# Patient Record
Sex: Male | Born: 1970 | Race: Black or African American | Marital: Married | Smoking: Never smoker
Health system: Southern US, Community
[De-identification: ages and names within clinical notes are randomized; demographics above are authoritative.]

## PROBLEM LIST (undated history)

## (undated) DIAGNOSIS — M519 Unspecified thoracic, thoracolumbar and lumbosacral intervertebral disc disorder: Secondary | ICD-10-CM

## (undated) DIAGNOSIS — E785 Hyperlipidemia, unspecified: Secondary | ICD-10-CM

## (undated) DIAGNOSIS — N419 Inflammatory disease of prostate, unspecified: Secondary | ICD-10-CM

## (undated) DIAGNOSIS — K219 Gastro-esophageal reflux disease without esophagitis: Secondary | ICD-10-CM

## (undated) DIAGNOSIS — K8020 Calculus of gallbladder without cholecystitis without obstruction: Secondary | ICD-10-CM

## (undated) DIAGNOSIS — IMO0002 Reserved for concepts with insufficient information to code with codable children: Secondary | ICD-10-CM

## (undated) DIAGNOSIS — K589 Irritable bowel syndrome without diarrhea: Secondary | ICD-10-CM

## (undated) HISTORY — PX: OTHER SURGICAL HISTORY: SHX169

## (undated) HISTORY — DX: Hyperlipidemia, unspecified: E78.5

## (undated) HISTORY — DX: Inflammatory disease of prostate, unspecified: N41.9

## (undated) HISTORY — DX: Calculus of gallbladder without cholecystitis without obstruction: K80.20

## (undated) HISTORY — PX: BUNIONECTOMY: SHX129

## (undated) HISTORY — DX: Unspecified thoracic, thoracolumbar and lumbosacral intervertebral disc disorder: M51.9

## (undated) HISTORY — DX: Irritable bowel syndrome without diarrhea: K58.9

## (undated) HISTORY — DX: Reserved for concepts with insufficient information to code with codable children: IMO0002

---
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
## 2000-09-09 ENCOUNTER — Emergency Department (HOSPITAL_COMMUNITY): Admission: EM | Admit: 2000-09-09 | Discharge: 2000-09-09 | Payer: Self-pay | Admitting: Emergency Medicine

## 2000-09-09 ENCOUNTER — Encounter: Payer: Self-pay | Admitting: Emergency Medicine

## 2000-09-15 ENCOUNTER — Emergency Department (HOSPITAL_COMMUNITY): Admission: EM | Admit: 2000-09-15 | Discharge: 2000-09-15 | Payer: Self-pay | Admitting: Emergency Medicine

## 2004-01-16 ENCOUNTER — Emergency Department (HOSPITAL_COMMUNITY): Admission: EM | Admit: 2004-01-16 | Discharge: 2004-01-17 | Payer: Self-pay | Admitting: Emergency Medicine

## 2004-01-25 ENCOUNTER — Ambulatory Visit: Payer: Self-pay | Admitting: Internal Medicine

## 2004-01-25 ENCOUNTER — Ambulatory Visit (HOSPITAL_COMMUNITY): Admission: RE | Admit: 2004-01-25 | Discharge: 2004-01-25 | Payer: Self-pay | Admitting: Internal Medicine

## 2004-07-08 ENCOUNTER — Ambulatory Visit: Payer: Self-pay | Admitting: Internal Medicine

## 2005-03-09 ENCOUNTER — Ambulatory Visit: Payer: Self-pay | Admitting: Internal Medicine

## 2005-10-19 ENCOUNTER — Ambulatory Visit: Payer: Self-pay | Admitting: Internal Medicine

## 2005-10-31 ENCOUNTER — Ambulatory Visit: Payer: Self-pay | Admitting: Internal Medicine

## 2006-08-09 ENCOUNTER — Encounter: Admission: RE | Admit: 2006-08-09 | Discharge: 2006-08-09 | Payer: Self-pay | Admitting: Internal Medicine

## 2006-08-19 ENCOUNTER — Encounter (INDEPENDENT_AMBULATORY_CARE_PROVIDER_SITE_OTHER): Payer: Self-pay | Admitting: *Deleted

## 2006-08-19 ENCOUNTER — Encounter: Admission: RE | Admit: 2006-08-19 | Discharge: 2006-08-19 | Payer: Self-pay | Admitting: Internal Medicine

## 2006-09-21 ENCOUNTER — Encounter: Payer: Self-pay | Admitting: Internal Medicine

## 2006-09-21 DIAGNOSIS — N419 Inflammatory disease of prostate, unspecified: Secondary | ICD-10-CM | POA: Insufficient documentation

## 2006-09-21 DIAGNOSIS — IMO0002 Reserved for concepts with insufficient information to code with codable children: Secondary | ICD-10-CM

## 2006-09-21 DIAGNOSIS — S36112A Contusion of liver, initial encounter: Secondary | ICD-10-CM | POA: Insufficient documentation

## 2006-09-21 DIAGNOSIS — Z9189 Other specified personal risk factors, not elsewhere classified: Secondary | ICD-10-CM | POA: Insufficient documentation

## 2006-09-21 HISTORY — DX: Inflammatory disease of prostate, unspecified: N41.9

## 2006-09-21 HISTORY — DX: Reserved for concepts with insufficient information to code with codable children: IMO0002

## 2006-09-26 DIAGNOSIS — M545 Low back pain: Secondary | ICD-10-CM

## 2008-09-14 IMAGING — CT CT ABDOMEN W/ CM
2 of 5 series · 17 of 46 positions shown, 19 images · IV contrast (30CC OMNI 350 & [ID] OMNI 300)
Comparison: None. 
 CT ABDOMEN WITH CONTRAST:

CLINICAL DATA: Right upper quadrant pain. 
 CT ABDOMEN AND PELVIS WITH CONTRAST:
TECHNIQUE: Multidetector CT imaging of the abdomen and pelvis was performed following the standard protocol during bolus administration of intravenous contrast.
 Contrast:  777cc Omnipaque 300.

[Series 2: abdomen w/ · axial · 0.75mm/px · z∈[-324,+36]mm · 14 of 82 slices shown, 16 images]
[im 5/82  soft-tissue]
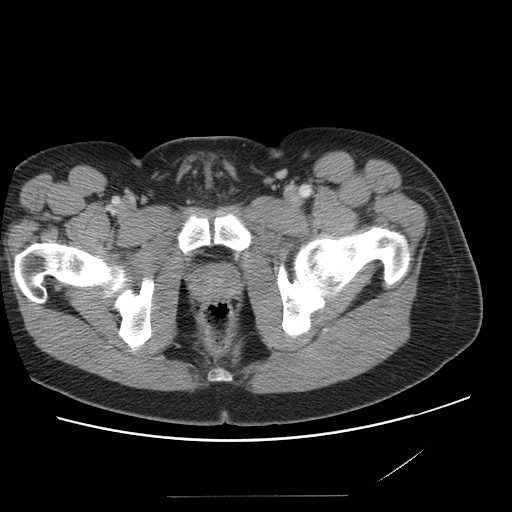
[im 5/82  bone]
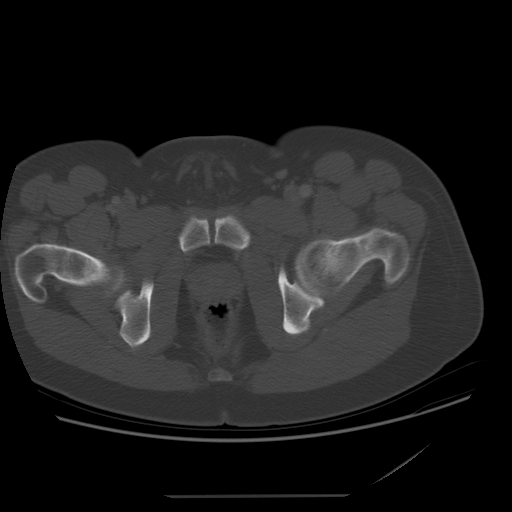
[im 13/82  soft-tissue]
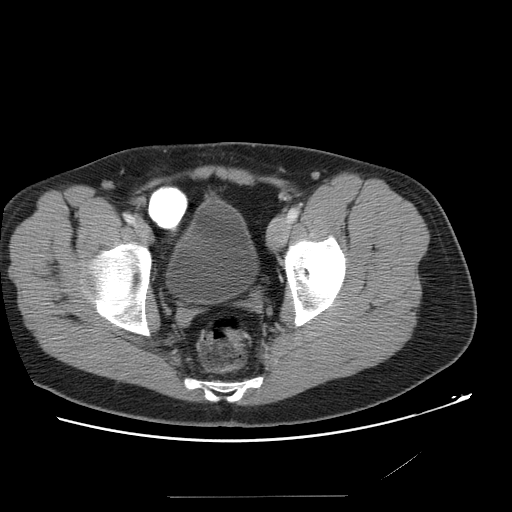
[im 17/82  soft-tissue]
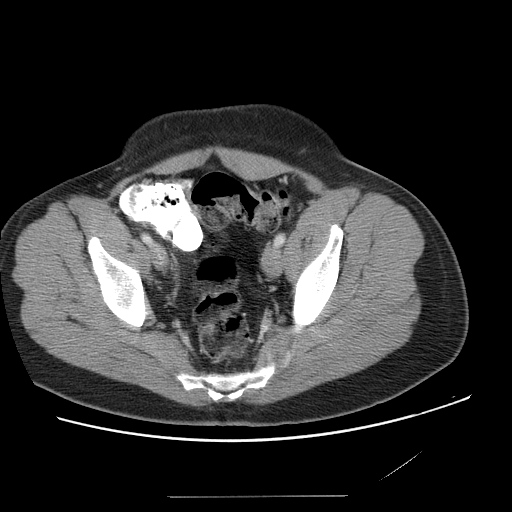
[im 21/82  soft-tissue]
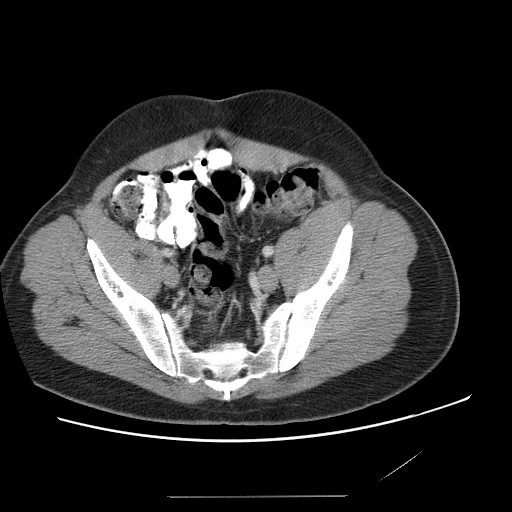
[im 29/82  soft-tissue]
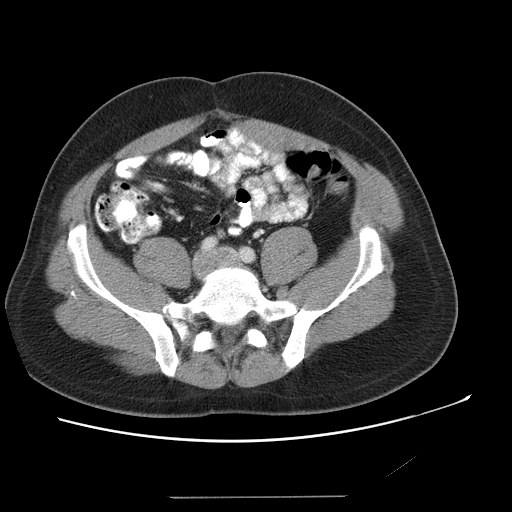
[im 33/82  soft-tissue]
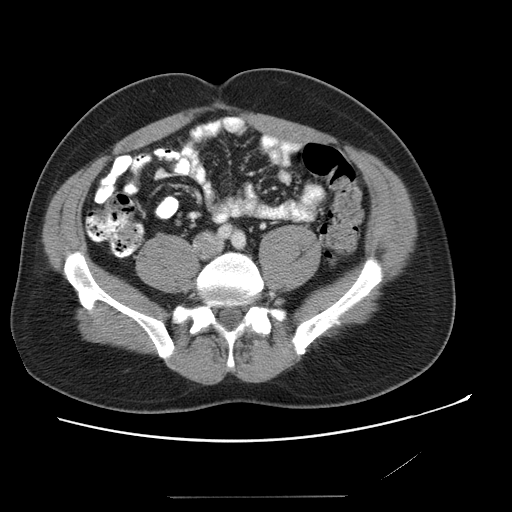
[im 37/82  soft-tissue]
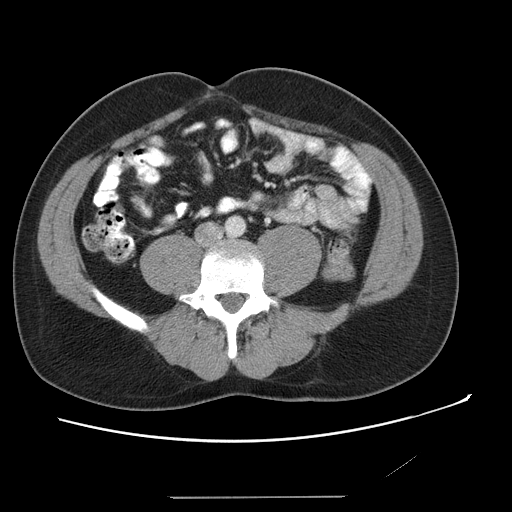
[im 45/82  soft-tissue]
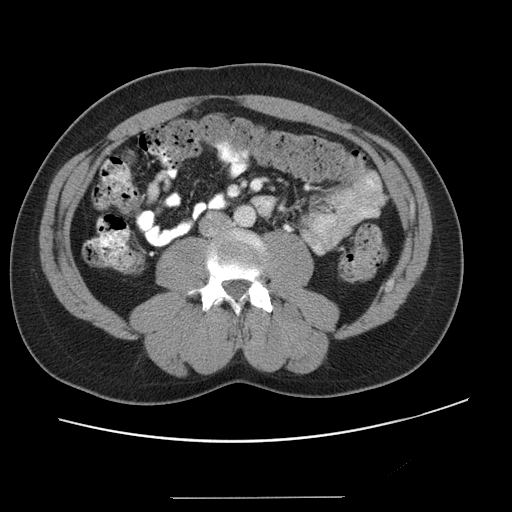
[im 49/82  soft-tissue]
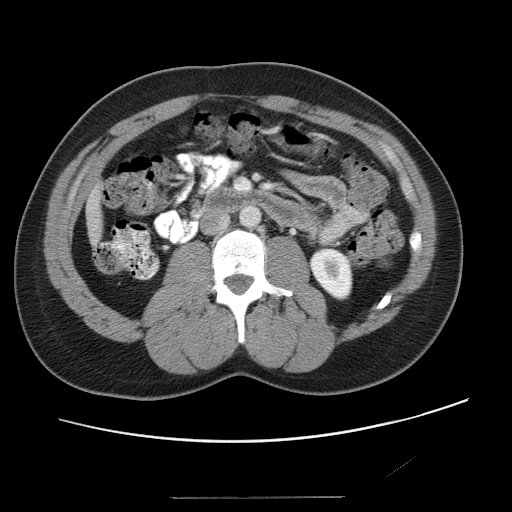
[im 49/82  bone]
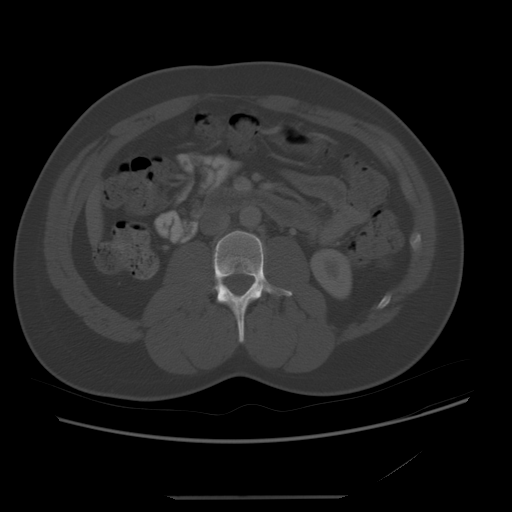
[im 53/82  soft-tissue]
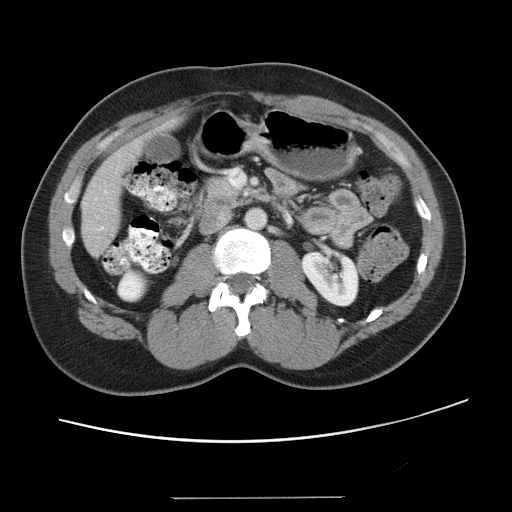
[im 61/82  soft-tissue]
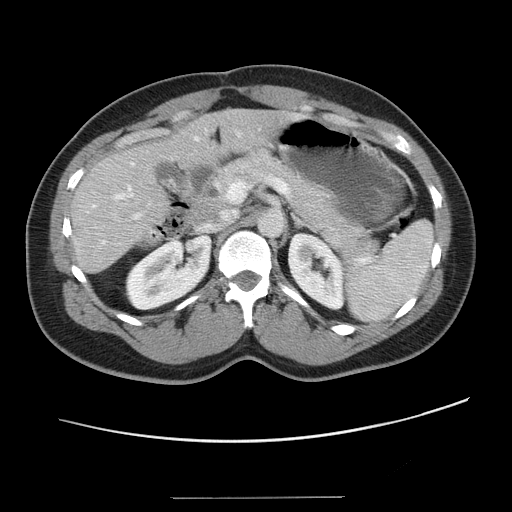
[im 65/82  soft-tissue]
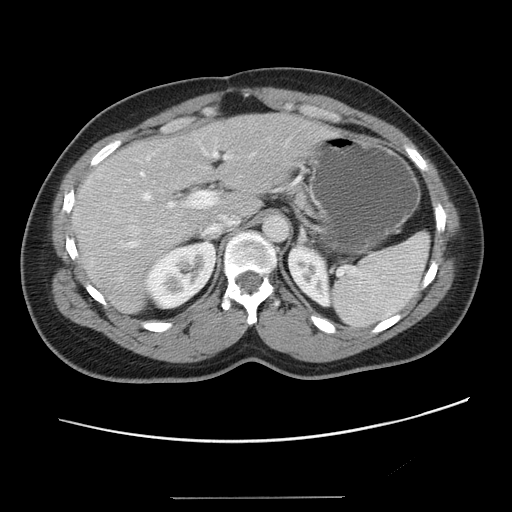
[im 69/82  soft-tissue]
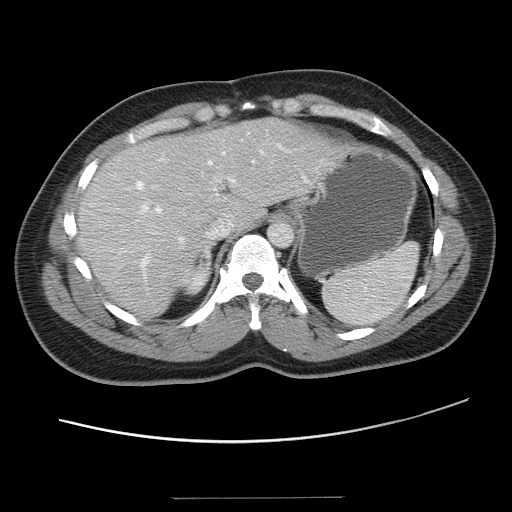
[im 77/82  soft-tissue]
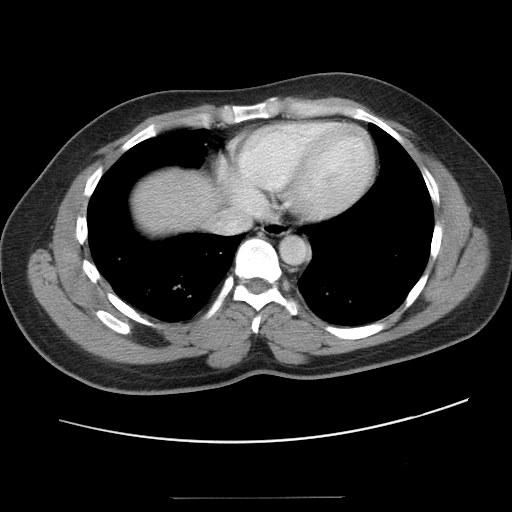

[Series 401: coronal · coronal · 0.86mm/px · 3 of 118 slices shown]
[im 40/118  soft-tissue]
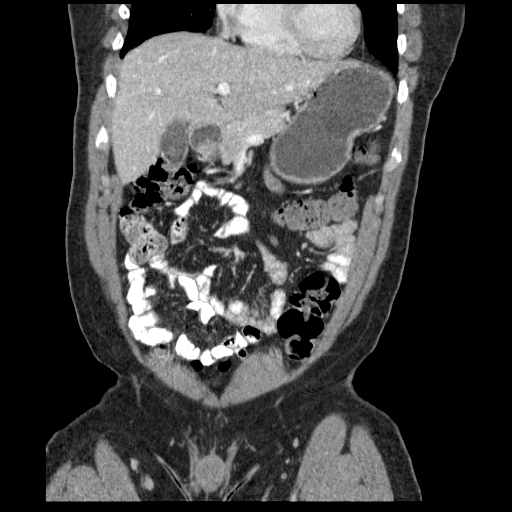
[im 53/118  soft-tissue]
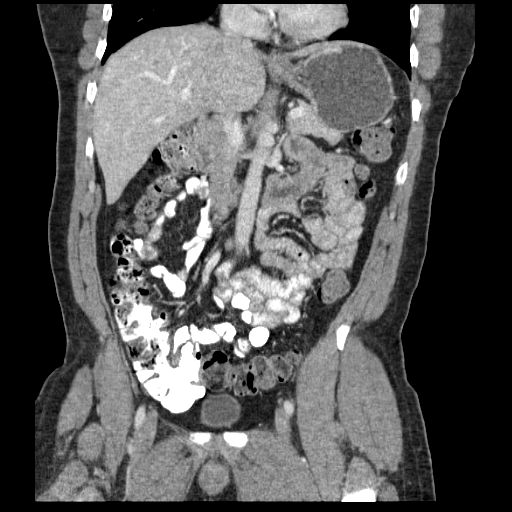
[im 66/118  soft-tissue]
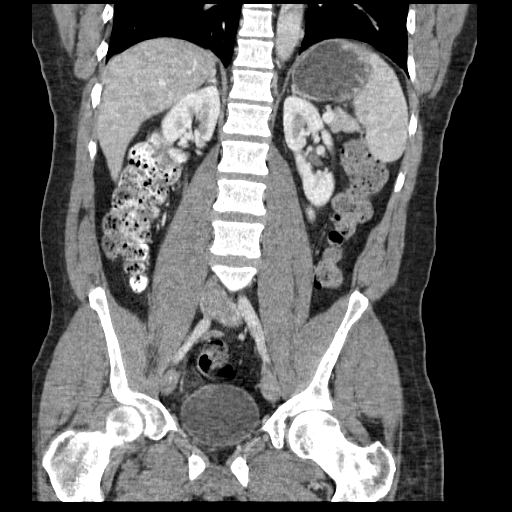

[17 of 46 positions shown; findings below may reference images not displayed]

FINDINGS: The lung bases are clear.  The liver enhances with no focal abnormality and no ductal dilatation is seen.  There is nodular attenuation within the gallbladder consistent with non calcified gallstones when compared to an ultrasound of 08/09/06.  No gallbladder wall thickening is seen.  Pancreas is normal in size and the pancreatic duct is not dilated.  The adrenal glands and spleen appear normal.   Kidneys enhance normally and on delayed images the pelvocaliceal systems appear normal.  The abdominal aorta is normal in caliber.  There is feces throughout the colon.
IMPRESSION: Non calcified gallstones in gallbladder.  No gallbladder wall thickening or ductal dilatation is seen.  
 CT PELVIS WITH CONTRAST:
FINDINGS: The appendix is well seen and appears normal.  Urinary bladder is unremarkable.  No bony abnormality is seen.
IMPRESSION: Negative CT of the pelvis.  No mass or adenopathy.  Appendix appears normal.

## 2008-10-26 ENCOUNTER — Ambulatory Visit: Payer: Self-pay | Admitting: Gastroenterology

## 2008-10-26 DIAGNOSIS — K589 Irritable bowel syndrome without diarrhea: Secondary | ICD-10-CM

## 2008-10-26 HISTORY — DX: Irritable bowel syndrome, unspecified: K58.9

## 2008-10-27 ENCOUNTER — Telehealth (INDEPENDENT_AMBULATORY_CARE_PROVIDER_SITE_OTHER): Payer: Self-pay | Admitting: *Deleted

## 2008-10-27 LAB — CONVERTED CEMR LAB
AST: 85 units/L — ABNORMAL HIGH (ref 0–37)
BUN: 10 mg/dL (ref 6–23)
Basophils Relative: 0.1 % (ref 0.0–3.0)
Calcium: 8.8 mg/dL (ref 8.4–10.5)
Chloride: 104 meq/L (ref 96–112)
Creatinine, Ser: 1.2 mg/dL (ref 0.4–1.5)
Eosinophils Absolute: 0.2 10*3/uL (ref 0.0–0.7)
GFR calc non Af Amer: 86.76 mL/min (ref >60)
HCT: 45.3 % (ref 39.0–52.0)
Hemoglobin: 15.8 g/dL (ref 13.0–17.0)
Lymphocytes Relative: 27.7 % (ref 12.0–46.0)
Lymphs Abs: 2.2 10*3/uL (ref 0.7–4.0)
MCHC: 34.8 g/dL (ref 30.0–36.0)
MCV: 88.5 fL (ref 78.0–100.0)
Neutro Abs: 4.9 10*3/uL (ref 1.4–7.7)
RBC: 5.12 M/uL (ref 4.22–5.81)
RDW: 13.4 % (ref 11.5–14.6)
Total Bilirubin: 1.1 mg/dL (ref 0.3–1.2)

## 2008-11-12 ENCOUNTER — Ambulatory Visit: Payer: Self-pay | Admitting: Gastroenterology

## 2008-11-15 LAB — CONVERTED CEMR LAB
ALT: 25 units/L (ref 0–53)
Alkaline Phosphatase: 61 units/L (ref 39–117)
Bilirubin, Direct: 0.2 mg/dL (ref 0.0–0.3)
Total Bilirubin: 1.2 mg/dL (ref 0.3–1.2)

## 2010-01-29 ENCOUNTER — Encounter: Payer: Self-pay | Admitting: Internal Medicine

## 2011-07-05 ENCOUNTER — Telehealth: Payer: Self-pay | Admitting: Internal Medicine

## 2011-07-05 NOTE — Telephone Encounter (Signed)
Pt was last seen in 2007.  He would like to be worked in for back pain before Tues.  He has [employer].

## 2011-07-05 NOTE — Telephone Encounter (Signed)
Ok with me 

## 2011-07-06 NOTE — Telephone Encounter (Signed)
APPT Monday July 1.

## 2011-07-07 ENCOUNTER — Encounter: Payer: Self-pay | Admitting: Internal Medicine

## 2011-07-07 DIAGNOSIS — Z0001 Encounter for general adult medical examination with abnormal findings: Secondary | ICD-10-CM | POA: Insufficient documentation

## 2011-07-07 DIAGNOSIS — M545 Low back pain: Secondary | ICD-10-CM | POA: Insufficient documentation

## 2011-07-09 ENCOUNTER — Ambulatory Visit (INDEPENDENT_AMBULATORY_CARE_PROVIDER_SITE_OTHER): Payer: BC Managed Care – PPO | Admitting: Internal Medicine

## 2011-07-09 ENCOUNTER — Encounter: Payer: Self-pay | Admitting: Internal Medicine

## 2011-07-09 ENCOUNTER — Other Ambulatory Visit (INDEPENDENT_AMBULATORY_CARE_PROVIDER_SITE_OTHER): Payer: BC Managed Care – PPO

## 2011-07-09 VITALS — BP 120/90 | HR 84 | Temp 98.0°F | Ht 66.0 in | Wt 185.0 lb

## 2011-07-09 DIAGNOSIS — Z0000 Encounter for general adult medical examination without abnormal findings: Secondary | ICD-10-CM

## 2011-07-09 DIAGNOSIS — M519 Unspecified thoracic, thoracolumbar and lumbosacral intervertebral disc disorder: Secondary | ICD-10-CM | POA: Insufficient documentation

## 2011-07-09 DIAGNOSIS — R209 Unspecified disturbances of skin sensation: Secondary | ICD-10-CM

## 2011-07-09 DIAGNOSIS — K8020 Calculus of gallbladder without cholecystitis without obstruction: Secondary | ICD-10-CM | POA: Insufficient documentation

## 2011-07-09 DIAGNOSIS — R202 Paresthesia of skin: Secondary | ICD-10-CM

## 2011-07-09 HISTORY — DX: Unspecified thoracic, thoracolumbar and lumbosacral intervertebral disc disorder: M51.9

## 2011-07-09 HISTORY — DX: Calculus of gallbladder without cholecystitis without obstruction: K80.20

## 2011-07-09 LAB — CBC WITH DIFFERENTIAL/PLATELET
Basophils Relative: 0.5 % (ref 0.0–3.0)
Eosinophils Absolute: 0.5 10*3/uL (ref 0.0–0.7)
HCT: 45.1 % (ref 39.0–52.0)
Hemoglobin: 15.5 g/dL (ref 13.0–17.0)
MCHC: 34.4 g/dL (ref 30.0–36.0)
MCV: 87.5 fl (ref 78.0–100.0)
Monocytes Absolute: 0.6 10*3/uL (ref 0.1–1.0)
Neutro Abs: 6.4 10*3/uL (ref 1.4–7.7)
RBC: 5.16 Mil/uL (ref 4.22–5.81)

## 2011-07-09 LAB — URINALYSIS, ROUTINE W REFLEX MICROSCOPIC
Bilirubin Urine: NEGATIVE
Ketones, ur: NEGATIVE
Leukocytes, UA: NEGATIVE
Nitrite: NEGATIVE
Total Protein, Urine: NEGATIVE
Urine Glucose: NEGATIVE
Urobilinogen, UA: 1 (ref 0.0–1.0)

## 2011-07-09 LAB — LIPID PANEL
Cholesterol: 233 mg/dL — ABNORMAL HIGH (ref 0–200)
Total CHOL/HDL Ratio: 6
Triglycerides: 205 mg/dL — ABNORMAL HIGH (ref 0.0–149.0)
VLDL: 41 mg/dL — ABNORMAL HIGH (ref 0.0–40.0)

## 2011-07-09 LAB — HEPATIC FUNCTION PANEL
Bilirubin, Direct: 0.1 mg/dL (ref 0.0–0.3)
Total Bilirubin: 1 mg/dL (ref 0.3–1.2)

## 2011-07-09 LAB — BASIC METABOLIC PANEL
Calcium: 9.3 mg/dL (ref 8.4–10.5)
Creatinine, Ser: 1.1 mg/dL (ref 0.4–1.5)

## 2011-07-09 LAB — PSA: PSA: 0.41 ng/mL (ref 0.10–4.00)

## 2011-07-09 MED ORDER — NAPROXEN 500 MG PO TABS: 500.0000 mg | ORAL_TABLET | Freq: Two times a day (BID) | ORAL | Status: DC

## 2011-07-09 MED ORDER — CYCLOBENZAPRINE HCL 5 MG PO TABS: 5.0000 mg | ORAL_TABLET | Freq: Three times a day (TID) | ORAL | Status: AC | PRN

## 2011-07-09 NOTE — Assessment & Plan Note (Signed)
Today located somewhat high lumbar region, exam benign,  for nsaid/flexeril trial prn,  to f/u any worsening symptoms or concerns

## 2011-07-09 NOTE — Assessment & Plan Note (Signed)
Typical bilat early cts like symptoms  - for b12 level with labs, also for bilat wrsit splint at night prn

## 2011-07-09 NOTE — Patient Instructions (Addendum)
Take all new medications as prescribed Continue all other medications as before Please use the wrist splints at night only to help with the numbness in the AM Please go to LAB in the Basement for the blood and/or urine tests to be done today You will be contacted by phone if any changes need to be made immediately.  Otherwise, you will receive a letter about your results with an explanation. Please return in 1 year for your yearly visit, or sooner if needed, with Lab testing done 3-5 days before
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
## 2011-07-14 ENCOUNTER — Encounter: Payer: Self-pay | Admitting: Internal Medicine

## 2011-07-14 NOTE — Assessment & Plan Note (Signed)

## 2011-10-29 ENCOUNTER — Telehealth: Payer: Self-pay

## 2011-10-29 NOTE — Telephone Encounter (Signed)
Completed letter and informed the patient and faxed to [phone] per pt. Request.

## 2011-10-29 NOTE — Telephone Encounter (Signed)
Based on his last exam as I documented July 2013, and his MRI from 2006, all I could say is to avoid repetitive bending, as well as lifting more than 50 lbs as he was o/w doing ok  If this is ok, robin to do letter

## 2011-10-29 NOTE — Telephone Encounter (Signed)
Pt called requesting a supportive letter stating his limitations and how they prevent him from working? Please advise

## 2012-03-03 ENCOUNTER — Encounter: Payer: Self-pay | Admitting: Internal Medicine

## 2012-03-03 ENCOUNTER — Ambulatory Visit (INDEPENDENT_AMBULATORY_CARE_PROVIDER_SITE_OTHER): Payer: BC Managed Care – PPO | Admitting: Internal Medicine

## 2012-03-03 VITALS — BP 132/100 | HR 74 | Temp 98.0°F | Ht 66.0 in | Wt 186.0 lb

## 2012-03-03 DIAGNOSIS — R030 Elevated blood-pressure reading, without diagnosis of hypertension: Secondary | ICD-10-CM | POA: Insufficient documentation

## 2012-03-03 DIAGNOSIS — M545 Low back pain: Secondary | ICD-10-CM

## 2012-03-03 MED ORDER — TRAMADOL HCL 50 MG PO TABS: 50.0000 mg | ORAL_TABLET | Freq: Four times a day (QID) | ORAL | Status: DC | PRN

## 2012-03-03 MED ORDER — TIZANIDINE HCL 4 MG PO TABS: 4.0000 mg | ORAL_TABLET | Freq: Four times a day (QID) | ORAL | Status: DC | PRN

## 2012-03-03 NOTE — Assessment & Plan Note (Signed)
With mod to severe flare it seems with stable neuro exam, for change flexeril to zanaflex and pain control, finding today mostly subjective, might benefit from pain clinic, has not seen ortho recently - will refer
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
## 2012-03-03 NOTE — Patient Instructions (Addendum)
Please take all new medication as prescribed Please continue all other medications as before, and refills have been done if requested. You will be contacted regarding the referral for: orthopedic Thank you for enrolling in MyChart. Please follow the instructions below to securely access your online medical record. MyChart allows you to send messages to your doctor, view your test results, renew your prescriptions, schedule appointments, and more. To Log into My Chart online, please go by [employer] or [employer] to [email or web address], or download the MyChart App from the [employer] of [employer] .  Your Username is: [email or web address]  (password [phone]) Please send a practice Message on Mychart later today. Please return in July 2014, or sooner if needed, with Lab testing done 3-5 days before

## 2012-03-03 NOTE — Assessment & Plan Note (Signed)
Asked wife for diligence in helping to monitor BP at home, goal < 140/90

## 2012-03-04 ENCOUNTER — Telehealth: Payer: Self-pay

## 2012-03-04 NOTE — Telephone Encounter (Signed)
I respectfully decline, as this is not officially necessary in the disability application process

## 2012-03-04 NOTE — Telephone Encounter (Signed)
Called left message to call back 

## 2012-03-04 NOTE — Telephone Encounter (Signed)
Message copied by Pincus Sanes on Tue Mar 04, 2012  9:42 AM ------      Message from: Corwin Levins      Created: Mon Mar 03, 2012  4:06 PM       This is normally not needed, as they go by the medical record and an exam by an independent MD at the time of application      ----- Message -----         From: Vladimir Crofts Ewing         Sent: 03/03/2012   3:02 PM           To: Corwin Levins, MD            The patient would like to know if you could write a letter stating his limitations for work.  He is trying to get disability       ------

## 2012-03-04 NOTE — Telephone Encounter (Signed)
Informed the patient of MD's response to request.  The patient stated his Mark Adams has asked for a letter.

## 2012-03-05 NOTE — Telephone Encounter (Signed)
Called the patient informed of MD's instructions. 

## 2012-04-16 ENCOUNTER — Other Ambulatory Visit: Payer: Self-pay | Admitting: Internal Medicine

## 2012-11-13 ENCOUNTER — Other Ambulatory Visit: Payer: Self-pay

## 2013-02-12 ENCOUNTER — Other Ambulatory Visit: Payer: Self-pay | Admitting: Internal Medicine

## 2013-03-07 ENCOUNTER — Other Ambulatory Visit: Payer: Self-pay | Admitting: Internal Medicine

## 2013-03-27 ENCOUNTER — Other Ambulatory Visit: Payer: Self-pay | Admitting: Internal Medicine

## 2013-05-06 ENCOUNTER — Other Ambulatory Visit: Payer: Self-pay | Admitting: Internal Medicine

## 2013-06-02 ENCOUNTER — Other Ambulatory Visit: Payer: Self-pay | Admitting: Internal Medicine

## 2013-06-07 ENCOUNTER — Other Ambulatory Visit: Payer: Self-pay | Admitting: Internal Medicine

## 2013-09-08 ENCOUNTER — Encounter: Payer: Self-pay | Admitting: Internal Medicine

## 2013-09-08 ENCOUNTER — Ambulatory Visit (INDEPENDENT_AMBULATORY_CARE_PROVIDER_SITE_OTHER)
Admission: RE | Admit: 2013-09-08 | Discharge: 2013-09-08 | Disposition: A | Discharge status: Home / Self Care | Payer: BC Managed Care – PPO | Source: Ambulatory Visit | Attending: Internal Medicine | Admitting: Internal Medicine

## 2013-09-08 ENCOUNTER — Ambulatory Visit (INDEPENDENT_AMBULATORY_CARE_PROVIDER_SITE_OTHER): Payer: BC Managed Care – PPO | Admitting: Internal Medicine

## 2013-09-08 ENCOUNTER — Other Ambulatory Visit (INDEPENDENT_AMBULATORY_CARE_PROVIDER_SITE_OTHER): Payer: BC Managed Care – PPO

## 2013-09-08 VITALS — BP 132/80 | HR 82 | Temp 98.4°F | Wt 187.0 lb

## 2013-09-08 DIAGNOSIS — K5900 Constipation, unspecified: Secondary | ICD-10-CM

## 2013-09-08 DIAGNOSIS — R220 Localized swelling, mass and lump, head: Secondary | ICD-10-CM

## 2013-09-08 DIAGNOSIS — R05 Cough: Secondary | ICD-10-CM

## 2013-09-08 DIAGNOSIS — R209 Unspecified disturbances of skin sensation: Secondary | ICD-10-CM

## 2013-09-08 DIAGNOSIS — R059 Cough, unspecified: Secondary | ICD-10-CM | POA: Insufficient documentation

## 2013-09-08 DIAGNOSIS — M25562 Pain in left knee: Secondary | ICD-10-CM

## 2013-09-08 DIAGNOSIS — M25569 Pain in unspecified knee: Secondary | ICD-10-CM

## 2013-09-08 DIAGNOSIS — Z0000 Encounter for general adult medical examination without abnormal findings: Secondary | ICD-10-CM

## 2013-09-08 DIAGNOSIS — Z23 Encounter for immunization: Secondary | ICD-10-CM

## 2013-09-08 DIAGNOSIS — R1013 Epigastric pain: Secondary | ICD-10-CM

## 2013-09-08 DIAGNOSIS — M25561 Pain in right knee: Secondary | ICD-10-CM

## 2013-09-08 DIAGNOSIS — K3189 Other diseases of stomach and duodenum: Secondary | ICD-10-CM

## 2013-09-08 DIAGNOSIS — R202 Paresthesia of skin: Secondary | ICD-10-CM

## 2013-09-08 DIAGNOSIS — R221 Localized swelling, mass and lump, neck: Secondary | ICD-10-CM

## 2013-09-08 LAB — BASIC METABOLIC PANEL
BUN: 12 mg/dL (ref 6–23)
CHLORIDE: 104 meq/L (ref 96–112)
CO2: 25 mEq/L (ref 19–32)
Calcium: 9.4 mg/dL (ref 8.4–10.5)
Creatinine, Ser: 1.2 mg/dL (ref 0.4–1.5)
GFR: 88.97 mL/min (ref >60.00)
Glucose, Bld: 87 mg/dL (ref 70–99)
Potassium: 4.2 mEq/L (ref 3.5–5.1)
SODIUM: 138 meq/L (ref 135–145)

## 2013-09-08 LAB — CBC WITH DIFFERENTIAL/PLATELET
Basophils Absolute: 0 10*3/uL (ref 0.0–0.1)
Basophils Relative: 0.5 % (ref 0.0–3.0)
EOS ABS: 0.2 10*3/uL (ref 0.0–0.7)
Eosinophils Relative: 2 % (ref 0.0–5.0)
HEMATOCRIT: 45.8 % (ref 39.0–52.0)
Hemoglobin: 15.7 g/dL (ref 13.0–17.0)
LYMPHS ABS: 2.3 10*3/uL (ref 0.7–4.0)
Lymphocytes Relative: 27.5 % (ref 12.0–46.0)
MCHC: 34.3 g/dL (ref 30.0–36.0)
MCV: 87.6 fl (ref 78.0–100.0)
MONO ABS: 0.5 10*3/uL (ref 0.1–1.0)
Monocytes Relative: 6.5 % (ref 3.0–12.0)
Neutro Abs: 5.3 10*3/uL (ref 1.4–7.7)
Neutrophils Relative %: 63.5 % (ref 43.0–77.0)
PLATELETS: 243 10*3/uL (ref 150.0–400.0)
RBC: 5.23 Mil/uL (ref 4.22–5.81)
RDW: 14.4 % (ref 11.5–15.5)
WBC: 8.4 10*3/uL (ref 4.0–10.5)

## 2013-09-08 LAB — URINALYSIS, ROUTINE W REFLEX MICROSCOPIC
Bilirubin Urine: NEGATIVE
Hgb urine dipstick: NEGATIVE
Ketones, ur: NEGATIVE
LEUKOCYTES UA: NEGATIVE
NITRITE: NEGATIVE
PH: 6 (ref 5.0–8.0)
RBC / HPF: NONE SEEN (ref 0–?)
SPECIFIC GRAVITY, URINE: 1.02 (ref 1.000–1.030)
Total Protein, Urine: NEGATIVE
UROBILINOGEN UA: 0.2 (ref 0.0–1.0)
Urine Glucose: NEGATIVE
WBC UA: NONE SEEN (ref 0–?)

## 2013-09-08 LAB — HEPATIC FUNCTION PANEL
ALT: 23 U/L (ref 0–53)
AST: 26 U/L (ref 0–37)
Albumin: 4.2 g/dL (ref 3.5–5.2)
Alkaline Phosphatase: 69 U/L (ref 39–117)
Bilirubin, Direct: 0.1 mg/dL (ref 0.0–0.3)
Total Bilirubin: 0.9 mg/dL (ref 0.2–1.2)
Total Protein: 7.5 g/dL (ref 6.0–8.3)

## 2013-09-08 LAB — LIPID PANEL
Cholesterol: 208 mg/dL — ABNORMAL HIGH (ref 0–200)
HDL: 30.3 mg/dL — AB (ref >39.00)
NONHDL: 177.7
Total CHOL/HDL Ratio: 7
Triglycerides: 214 mg/dL — ABNORMAL HIGH (ref 0.0–149.0)
VLDL: 42.8 mg/dL — ABNORMAL HIGH (ref 0.0–40.0)

## 2013-09-08 LAB — LDL CHOLESTEROL, DIRECT: LDL DIRECT: 123.4 mg/dL

## 2013-09-08 LAB — TSH: TSH: 1.15 u[IU]/mL (ref 0.35–4.50)

## 2013-09-08 LAB — PSA: PSA: 0.51 ng/mL (ref 0.10–4.00)

## 2013-09-08 MED ORDER — TRAMADOL HCL 50 MG PO TABS: 50.0000 mg | ORAL_TABLET | Freq: Four times a day (QID) | ORAL | Status: DC | PRN

## 2013-09-08 MED ORDER — PANTOPRAZOLE SODIUM 40 MG PO TBEC: 40.0000 mg | DELAYED_RELEASE_TABLET | Freq: Every day | ORAL | Status: DC

## 2013-09-08 NOTE — Assessment & Plan Note (Signed)
To change PPI to rx protonix 40, d/c nsaids and f/u if not improved 1-2 wks, consider GI referral, ? Need egd

## 2013-09-08 NOTE — Assessment & Plan Note (Signed)
Mostly AM cough, ? Related to night time reflux, but with ? Neck mass will also need cxr

## 2013-09-08 NOTE — Patient Instructions (Addendum)
You had the tetanus (Tdap) today  OK to stop the prilosec otc  (ok to cont the zantac at bedtime if you like)  Please stop the naproxyn (and all otc advil, alleve) due to the abdominal pain  Please take all new medication as prescribed - the generic protonix 40 mg per day  Please call if not improved, as we can try to make the protonix twice per day if ok with insurance, and you would probably then need to see GI as well (especially if the cough persists)  Ok to continue the miralax as you do; you can also consider taking Colace 100 mg up to twice per day as needed for stool softner as well  Please continue all other medications as before, and refills have been done if requested - the tramadol, although we would like to not have to use this if possible due to it maybe related to your GI problem  You will be contacted regarding the referral for: Dr Smith/Sport Medicine for your knees pain and gait problem  Please have the pharmacy call with any other refills you may need.  Please continue your efforts at being more active, low cholesterol diet, and weight control.  You are otherwise up to date with prevention measures today.  Please keep your appointments with your other specialists as you may have planned  Please go to the XRAY Department in the Basement (go straight as you get off the elevator) for the x-ray testing  Please go to the LAB in the Basement (turn left off the elevator) for the tests to be done today  You will be contacted by phone if any changes need to be made immediately.  Otherwise, you will receive a letter about your results with an explanation, but please check with MyChart first.  Please remember to sign up for MyChart if you have not done so, as this will be important to you in the future with finding out test results, communicating by private email, and scheduling acute appointments online when needed.  Please return in 1 year for your yearly visit, or sooner if  needed

## 2013-09-08 NOTE — Assessment & Plan Note (Addendum)
Incidental, unclear clinical signficance, for CT neck with CM  - r/o LA or other mass, is currently afeb and no pain, no fever, hold other specific tx for now, may need ENT eval as well

## 2013-09-08 NOTE — Assessment & Plan Note (Signed)
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
## 2013-09-08 NOTE — Progress Notes (Signed)
Pre visit review using our clinic review tool, if applicable. No additional management support is needed unless otherwise documented below in the visit note. 

## 2013-09-08 NOTE — Assessment & Plan Note (Signed)
?   Cts related, vs c-spine or other; overall mild, no pain ,exam ok, for bilateral wrist splints trial, but consider futher eval such as ortho eval of c-spine if not improved (such as spine and scoliosis ctr)

## 2013-09-08 NOTE — Assessment & Plan Note (Signed)
To minimize pain meds as able, cont miralax, also colace bid prn

## 2013-09-09 ENCOUNTER — Ambulatory Visit (INDEPENDENT_AMBULATORY_CARE_PROVIDER_SITE_OTHER)
Admission: RE | Admit: 2013-09-09 | Discharge: 2013-09-09 | Disposition: A | Discharge status: Home / Self Care | Payer: BC Managed Care – PPO | Source: Ambulatory Visit | Attending: Internal Medicine | Admitting: Internal Medicine

## 2013-09-09 DIAGNOSIS — R220 Localized swelling, mass and lump, head: Secondary | ICD-10-CM

## 2013-09-09 DIAGNOSIS — R059 Cough, unspecified: Secondary | ICD-10-CM

## 2013-09-09 DIAGNOSIS — R221 Localized swelling, mass and lump, neck: Secondary | ICD-10-CM

## 2013-09-09 DIAGNOSIS — R05 Cough: Secondary | ICD-10-CM

## 2013-09-09 MED ORDER — IOHEXOL 300 MG/ML  SOLN
80.0000 mL | Freq: Once | INTRAMUSCULAR | Status: AC | PRN
  Administered 2013-09-09: 75 mL via INTRAVENOUS

## 2013-09-11 ENCOUNTER — Ambulatory Visit (INDEPENDENT_AMBULATORY_CARE_PROVIDER_SITE_OTHER): Payer: BC Managed Care – PPO | Admitting: Family Medicine

## 2013-09-11 ENCOUNTER — Ambulatory Visit (INDEPENDENT_AMBULATORY_CARE_PROVIDER_SITE_OTHER)
Admission: RE | Admit: 2013-09-11 | Discharge: 2013-09-11 | Disposition: A | Discharge status: Home / Self Care | Payer: BC Managed Care – PPO | Source: Ambulatory Visit | Attending: Family Medicine | Admitting: Family Medicine

## 2013-09-11 ENCOUNTER — Ambulatory Visit: Payer: BC Managed Care – PPO | Admitting: Family Medicine

## 2013-09-11 ENCOUNTER — Encounter: Payer: Self-pay | Admitting: Family Medicine

## 2013-09-11 ENCOUNTER — Other Ambulatory Visit (INDEPENDENT_AMBULATORY_CARE_PROVIDER_SITE_OTHER): Payer: BC Managed Care – PPO

## 2013-09-11 VITALS — BP 120/84 | HR 80 | Ht 65.5 in | Wt 188.0 lb

## 2013-09-11 DIAGNOSIS — M25562 Pain in left knee: Principal | ICD-10-CM

## 2013-09-11 DIAGNOSIS — M6281 Muscle weakness (generalized): Secondary | ICD-10-CM

## 2013-09-11 DIAGNOSIS — M25569 Pain in unspecified knee: Secondary | ICD-10-CM

## 2013-09-11 DIAGNOSIS — M25561 Pain in right knee: Secondary | ICD-10-CM

## 2013-09-11 MED ORDER — GABAPENTIN 100 MG PO CAPS: 100.0000 mg | ORAL_CAPSULE | Freq: Every day | ORAL | Status: DC

## 2013-09-11 MED ORDER — DICLOFENAC SODIUM 2 % TD SOLN: TRANSDERMAL | Status: DC
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
## 2013-09-11 NOTE — Patient Instructions (Signed)
Good to meet you Exercises 3 times a week.  Would love for you to walk in a pool or stationary bike Consider knee or thigh compression sleeve at [employer] or [employer].  Really would like a thihg one if possible.  Vitamin D 2000 IU daily B12 daily B6  daily Pennsaid 2 times daily as needed to knees to help with pain.  Gabapentin  at night to help with nerve irritation.  When sitting straighten leg and try to raise it, hold it 2 seconds and down slow for count of 3 seconds, repeat whenever sitting.  Whey protein isolate. Have 1 scoop of this daily ot increase your protein and help build muscle.  Come back and see me again in 3-4 weeks.

## 2013-09-17 ENCOUNTER — Telehealth: Payer: Self-pay | Admitting: *Deleted

## 2013-09-17 ENCOUNTER — Other Ambulatory Visit: Payer: Self-pay | Admitting: *Deleted

## 2013-09-17 MED ORDER — GABAPENTIN 100 MG PO CAPS: 100.0000 mg | ORAL_CAPSULE | Freq: Every day | ORAL | Status: DC

## 2013-09-17 MED ORDER — NAPROXEN 500 MG PO TABS: ORAL_TABLET | ORAL | Status: DC

## 2013-09-17 NOTE — Telephone Encounter (Signed)
Left msg on triage requesting call bck. Called pt back he stated he saw Dr. Jonny Ruiz 09/11/13 he told him he needed refill on his naproxen but md sent tramadol. Also pt saw Dr. Katrinka Blazing 09/11/13 & pharmacy didn't receive his prescription. Inform pt md sent script to [facility] pharmacy. Pt stated the [facility]entin [facility]. Inform pt we will [facility].../lmb

## 2013-09-17 NOTE — Telephone Encounter (Signed)
Resent [facility]entin to [facility] first script printed out...Raechel Chute

## 2013-09-28 ENCOUNTER — Telehealth: Payer: Self-pay

## 2013-09-28 MED ORDER — TIZANIDINE HCL 4 MG PO TABS: 4.0000 mg | ORAL_TABLET | Freq: Four times a day (QID) | ORAL | Status: DC | PRN

## 2013-09-28 NOTE — Telephone Encounter (Signed)
Received a refill request for Tizanidine HCL 4 mg tablet. Directions to take 1 tablet by mouth every 6 hours as needed. I do not see this on patient's current med list. Please advise.

## 2013-09-28 NOTE — Telephone Encounter (Signed)
Done erx 

## 2013-11-19 ENCOUNTER — Encounter: Payer: Self-pay | Admitting: Internal Medicine

## 2013-11-19 NOTE — Telephone Encounter (Signed)
[name] to see message above

## 2014-01-05 ENCOUNTER — Other Ambulatory Visit: Payer: Self-pay | Admitting: Family Medicine

## 2014-08-05 ENCOUNTER — Other Ambulatory Visit: Payer: Self-pay | Admitting: Internal Medicine

## 2014-09-08 ENCOUNTER — Ambulatory Visit (HOSPITAL_COMMUNITY): Payer: BLUE CROSS/BLUE SHIELD | Attending: Podiatry | Admitting: Physical Therapy

## 2014-09-08 DIAGNOSIS — R29898 Other symptoms and signs involving the musculoskeletal system: Secondary | ICD-10-CM

## 2014-09-08 DIAGNOSIS — M2141 Flat foot [pes planus] (acquired), right foot: Secondary | ICD-10-CM | POA: Diagnosis present

## 2014-09-08 DIAGNOSIS — R262 Difficulty in walking, not elsewhere classified: Secondary | ICD-10-CM | POA: Diagnosis not present

## 2014-09-08 DIAGNOSIS — M21371 Foot drop, right foot: Secondary | ICD-10-CM | POA: Diagnosis present

## 2014-09-08 DIAGNOSIS — M25571 Pain in right ankle and joints of right foot: Secondary | ICD-10-CM | POA: Insufficient documentation

## 2014-09-08 DIAGNOSIS — M25671 Stiffness of right ankle, not elsewhere classified: Secondary | ICD-10-CM | POA: Diagnosis present
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
## 2014-09-08 NOTE — Patient Instructions (Addendum)
Heel Raise (Sitting)   Raise heels, keeping toes on floor. Repeat _10___ times per set. Do _1___ sets per session. Do __3__ sessions per day.  http://orth.exer.us/44   Copyright  VHI. All rights reserved.  Toe Raise (Sitting)   Raise toes, keeping heels on floor. Repeat _10___ times per set. Do __1__ sets per session. Do __3__ sessions per day.  http://orth.exer.us/46   Copyright  VHI. All rights reserved.  Toe Curl: Unilateral   With right foot resting on towel, slowly bunch up towel by curling toes. Repeat ___20_ times per set. Do ___1_ sets per session. Do _3___ sessions per day.  http://orth.exer.us/18   Copyright  VHI. All rights reserved.  Plantar Flexion: Isometric   Press left foot into ball or rolled pillow against wall. Hold __3-5__ seconds. Relax. Repeat _10___ times per set. Do __1__ sets per session. Do ___2_ sessions per day.  http://orth.exer.us/0   Copyright  VHI. All rights reserved.  Dorsiflexion: Isometric   With ball or rolled pillow between feet, squeeze feet together. Hold __3-5__ seconds. Relax. Repeat _10___ times per set. Do __1__ sets per session. Do ___2_ sessions per day.  http://orth.exer.us/2   Copyright  VHI. All rights reserved.  Eversion: Isometric   Press outer border of right foot into ball or rolled pillow against wall. Hold _3-5___ seconds. Relax. Repeat _10___ times per set. Do 1____ sets per session. Do __2__ sessions per day.  http://orth.exer.us/4   Copyright  VHI. All rights reserved.  Inversion: Isometric   Press inner borders of feet into ball or rolled pillow between feet. Hold __3-5__ seconds. Relax. Repeat ___10_ times per set. Do _1___ sets per session. Do ____ sessions per day. 2 http://orth.exer.us/6   Copyright  VHI. All rights reserved.  Heel Raise: Bilateral (Standing)   Rise on balls of feet. Repeat __10_ times per set. Do _1__ sets per session. Do ___2_ sessions per  day.  http://orth.exer.us/38   Copyright  VHI. All rights reserved.  Toe Spread, Sitting   Sit and spread toes. Then bring them together. Hold each position 3___ seconds. Repeat _10__ times per session. Do _2__ sessions per day.  Copyright  VHI. All rights reserved.

## 2014-10-29 ENCOUNTER — Other Ambulatory Visit: Payer: Self-pay | Admitting: Internal Medicine

## 2014-10-30 ENCOUNTER — Other Ambulatory Visit: Payer: Self-pay | Admitting: Internal Medicine

## 2014-11-01 ENCOUNTER — Other Ambulatory Visit: Payer: Self-pay | Admitting: Family Medicine

## 2014-11-02 NOTE — Telephone Encounter (Signed)
Refill denied. Pt has not been seen in over a year.  

## 2015-03-14 ENCOUNTER — Ambulatory Visit: Payer: BLUE CROSS/BLUE SHIELD | Admitting: Internal Medicine

## 2015-08-25 ENCOUNTER — Ambulatory Visit: Payer: Self-pay | Admitting: Internal Medicine

## 2015-08-30 ENCOUNTER — Other Ambulatory Visit (INDEPENDENT_AMBULATORY_CARE_PROVIDER_SITE_OTHER): Payer: BLUE CROSS/BLUE SHIELD

## 2015-08-30 ENCOUNTER — Ambulatory Visit (INDEPENDENT_AMBULATORY_CARE_PROVIDER_SITE_OTHER): Payer: BLUE CROSS/BLUE SHIELD | Admitting: Internal Medicine

## 2015-08-30 ENCOUNTER — Encounter: Payer: Self-pay | Admitting: Internal Medicine

## 2015-08-30 VITALS — BP 130/64 | HR 77 | Temp 98.3°F | Resp 20 | Wt 192.0 lb

## 2015-08-30 DIAGNOSIS — Z0001 Encounter for general adult medical examination with abnormal findings: Secondary | ICD-10-CM

## 2015-08-30 DIAGNOSIS — R1011 Right upper quadrant pain: Secondary | ICD-10-CM | POA: Insufficient documentation

## 2015-08-30 DIAGNOSIS — R6889 Other general symptoms and signs: Secondary | ICD-10-CM

## 2015-08-30 DIAGNOSIS — L309 Dermatitis, unspecified: Secondary | ICD-10-CM

## 2015-08-30 DIAGNOSIS — R7989 Other specified abnormal findings of blood chemistry: Secondary | ICD-10-CM

## 2015-08-30 DIAGNOSIS — F329 Major depressive disorder, single episode, unspecified: Secondary | ICD-10-CM | POA: Diagnosis not present

## 2015-08-30 DIAGNOSIS — F32A Depression, unspecified: Secondary | ICD-10-CM | POA: Insufficient documentation

## 2015-08-30 LAB — CBC WITH DIFFERENTIAL/PLATELET
BASOS ABS: 0 10*3/uL (ref 0.0–0.1)
Basophils Relative: 0.2 % (ref 0.0–3.0)
Eosinophils Absolute: 0.3 10*3/uL (ref 0.0–0.7)
Eosinophils Relative: 2.9 % (ref 0.0–5.0)
HCT: 44.2 % (ref 39.0–52.0)
HEMOGLOBIN: 15.4 g/dL (ref 13.0–17.0)
LYMPHS ABS: 2.8 10*3/uL (ref 0.7–4.0)
Lymphocytes Relative: 28 % (ref 12.0–46.0)
MCHC: 34.8 g/dL (ref 30.0–36.0)
MCV: 85.8 fl (ref 78.0–100.0)
MONO ABS: 0.8 10*3/uL (ref 0.1–1.0)
Monocytes Relative: 8.2 % (ref 3.0–12.0)
Neutro Abs: 6 10*3/uL (ref 1.4–7.7)
Neutrophils Relative %: 60.7 % (ref 43.0–77.0)
PLATELETS: 233 10*3/uL (ref 150.0–400.0)
RBC: 5.16 Mil/uL (ref 4.22–5.81)
RDW: 14.7 % (ref 11.5–15.5)
WBC: 9.9 10*3/uL (ref 4.0–10.5)

## 2015-08-30 LAB — LIPID PANEL
CHOLESTEROL: 239 mg/dL — AB (ref 0–200)
HDL: 34.4 mg/dL — AB (ref >39.00)
NonHDL: 204.68
Total CHOL/HDL Ratio: 7
Triglycerides: 322 mg/dL — ABNORMAL HIGH (ref 0.0–149.0)
VLDL: 64.4 mg/dL — AB (ref 0.0–40.0)

## 2015-08-30 LAB — BASIC METABOLIC PANEL
BUN: 13 mg/dL (ref 6–23)
CALCIUM: 9.1 mg/dL (ref 8.4–10.5)
CO2: 29 meq/L (ref 19–32)
Chloride: 104 mEq/L (ref 96–112)
Creatinine, Ser: 1.09 mg/dL (ref 0.40–1.50)
GFR: 93.8 mL/min (ref >60.00)
GLUCOSE: 91 mg/dL (ref 70–99)
POTASSIUM: 4.3 meq/L (ref 3.5–5.1)
SODIUM: 140 meq/L (ref 135–145)

## 2015-08-30 LAB — URINALYSIS, ROUTINE W REFLEX MICROSCOPIC
Bilirubin Urine: NEGATIVE
HGB URINE DIPSTICK: NEGATIVE
KETONES UR: NEGATIVE
Leukocytes, UA: NEGATIVE
NITRITE: NEGATIVE
RBC / HPF: NONE SEEN (ref 0–?)
SPECIFIC GRAVITY, URINE: 1.025 (ref 1.000–1.030)
Total Protein, Urine: NEGATIVE
URINE GLUCOSE: NEGATIVE
Urobilinogen, UA: 1 (ref 0.0–1.0)
WBC UA: NONE SEEN (ref 0–?)
pH: 6 (ref 5.0–8.0)

## 2015-08-30 LAB — HEPATIC FUNCTION PANEL
ALBUMIN: 4.5 g/dL (ref 3.5–5.2)
ALK PHOS: 71 U/L (ref 39–117)
ALT: 18 U/L (ref 0–53)
AST: 18 U/L (ref 0–37)
Bilirubin, Direct: 0.1 mg/dL (ref 0.0–0.3)
TOTAL PROTEIN: 7.5 g/dL (ref 6.0–8.3)
Total Bilirubin: 0.7 mg/dL (ref 0.2–1.2)

## 2015-08-30 LAB — LDL CHOLESTEROL, DIRECT: LDL DIRECT: 117 mg/dL

## 2015-08-30 LAB — TSH: TSH: 0.87 u[IU]/mL (ref 0.35–4.50)

## 2015-08-30 LAB — PSA: PSA: 0.48 ng/mL (ref 0.10–4.00)

## 2015-08-30 MED ORDER — PANTOPRAZOLE SODIUM 40 MG PO TBEC: 40.0000 mg | DELAYED_RELEASE_TABLET | Freq: Every day | ORAL | 3 refills | Status: DC

## 2015-08-30 MED ORDER — TRAMADOL HCL 50 MG PO TABS: 50.0000 mg | ORAL_TABLET | Freq: Four times a day (QID) | ORAL | 3 refills | Status: DC | PRN

## 2015-08-30 MED ORDER — GABAPENTIN 100 MG PO CAPS: ORAL_CAPSULE | ORAL | 1 refills | Status: DC

## 2015-08-30 MED ORDER — TRIAMCINOLONE ACETONIDE 0.1 % EX CREA: 1.0000 "application " | TOPICAL_CREAM | Freq: Two times a day (BID) | CUTANEOUS | 1 refills | Status: AC

## 2015-08-30 MED ORDER — ESCITALOPRAM OXALATE 10 MG PO TABS: 10.0000 mg | ORAL_TABLET | Freq: Every day | ORAL | 3 refills | Status: DC

## 2015-08-30 NOTE — Assessment & Plan Note (Signed)
Mild to mod, for topical steroid,  to f/u any worsening symptoms or concerns
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
## 2015-08-30 NOTE — Patient Instructions (Signed)
Please take all new medication as prescribed - the lexapro 10 mg per day, steroid cream for the rash, and pain medication as needed  Please continue all other medications as before, and refills have been done if requested.  Please have the pharmacy call with any other refills you may need.  Please continue your efforts at being more active, low cholesterol diet, and weight control.  You are otherwise up to date with prevention measures today.  Please keep your appointments with your specialists as you may have planned  You will be contacted regarding the referral for: ultrasound  Please go to the LAB in the Basement (turn left off the elevator) for the tests to be done today  You will be contacted by phone if any changes need to be made immediately.  Otherwise, you will receive a letter about your results with an explanation, but please check with MyChart first.  Please remember to sign up for MyChart if you have not done so, as this will be important to you in the future with finding out test results, communicating by private email, and scheduling acute appointments online when needed.  Please return in 6 months, or sooner if needed

## 2015-08-30 NOTE — Assessment & Plan Note (Addendum)
Cant r/oGB, for lts and abd u/s,  to f/u any worsening symptoms or concerns  In addition to the time spent performing CPE, I spent an additional 25 minutes face to face,in which greater than 50% of this time was spent in counseling and coordination of care for patient's acute illness as documented.

## 2015-08-30 NOTE — Assessment & Plan Note (Signed)
Mild to mod, no SI or HI, for lexapro 10 qd,,  to f/u any worsening symptoms or concerns

## 2015-08-30 NOTE — Assessment & Plan Note (Signed)

## 2015-08-30 NOTE — Progress Notes (Signed)
Pre visit review using our clinic review tool, if applicable. No additional management support is needed unless otherwise documented below in the visit note. 

## 2015-09-01 ENCOUNTER — Ambulatory Visit
Admission: RE | Admit: 2015-09-01 | Discharge: 2015-09-01 | Disposition: A | Discharge status: Home / Self Care | Payer: BLUE CROSS/BLUE SHIELD | Source: Ambulatory Visit | Attending: Internal Medicine | Admitting: Internal Medicine

## 2015-09-01 DIAGNOSIS — R1011 Right upper quadrant pain: Secondary | ICD-10-CM

## 2015-10-14 ENCOUNTER — Ambulatory Visit (INDEPENDENT_AMBULATORY_CARE_PROVIDER_SITE_OTHER): Payer: BLUE CROSS/BLUE SHIELD | Admitting: Internal Medicine

## 2015-10-14 VITALS — BP 136/80 | HR 91 | Temp 98.7°F | Resp 20 | Wt 188.0 lb

## 2015-10-14 DIAGNOSIS — R300 Dysuria: Secondary | ICD-10-CM | POA: Diagnosis not present

## 2015-10-14 DIAGNOSIS — K8020 Calculus of gallbladder without cholecystitis without obstruction: Secondary | ICD-10-CM

## 2015-10-14 DIAGNOSIS — M519 Unspecified thoracic, thoracolumbar and lumbosacral intervertebral disc disorder: Secondary | ICD-10-CM | POA: Diagnosis not present

## 2015-10-14 LAB — POCT URINALYSIS DIPSTICK
Bilirubin, UA: NEGATIVE
Blood, UA: NEGATIVE
Glucose, UA: NEGATIVE
KETONES UA: NEGATIVE
LEUKOCYTES UA: NEGATIVE
NITRITE UA: NEGATIVE
PH UA: 7.5
PROTEIN UA: NEGATIVE
Spec Grav, UA: 1.02
UROBILINOGEN UA: NEGATIVE

## 2015-10-14 MED ORDER — DOXYCYCLINE HYCLATE 100 MG PO TABS: 100.0000 mg | ORAL_TABLET | Freq: Two times a day (BID) | ORAL | 0 refills | Status: DC

## 2015-10-14 NOTE — Progress Notes (Signed)
Pre visit review using our clinic review tool, if applicable. No additional management support is needed unless otherwise documented below in the visit note. 

## 2015-10-14 NOTE — Patient Instructions (Signed)
Please take all new medication as prescribed - the antibiotic   Your specimen will be sent for culture, but it may be negative, so OK to finish the antibiotics  Please continue all other medications as before, and refills have been done if requested.  Please have the pharmacy call with any other refills you may need.  Please continue your efforts at being more active, low cholesterol diet, and weight control.  Please keep your appointments with your specialists as you may have planned  Your Handicapped parking application is signed today

## 2015-10-14 NOTE — Assessment & Plan Note (Signed)
Recent u/s with gallstone, asympt now,  to f/u any worsening symptoms or concerns
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
## 2015-10-14 NOTE — Assessment & Plan Note (Signed)
Suspect UTI with possible left orchitis related, Udip reviewed - neg, but with exam today will tx empirically with antibx, check urine cx

## 2015-10-14 NOTE — Assessment & Plan Note (Signed)
Chronic stable, cont same tx, for f/u with disability application

## 2016-09-04 ENCOUNTER — Ambulatory Visit (INDEPENDENT_AMBULATORY_CARE_PROVIDER_SITE_OTHER): Payer: BLUE CROSS/BLUE SHIELD | Admitting: Internal Medicine

## 2016-09-04 ENCOUNTER — Encounter: Payer: Self-pay | Admitting: Internal Medicine

## 2016-09-04 VITALS — BP 136/90 | HR 76 | Temp 98.6°F | Ht 65.5 in | Wt 189.0 lb

## 2016-09-04 DIAGNOSIS — L309 Dermatitis, unspecified: Secondary | ICD-10-CM

## 2016-09-04 DIAGNOSIS — Z0001 Encounter for general adult medical examination with abnormal findings: Secondary | ICD-10-CM

## 2016-09-04 DIAGNOSIS — N390 Urinary tract infection, site not specified: Secondary | ICD-10-CM | POA: Diagnosis not present

## 2016-09-04 DIAGNOSIS — R300 Dysuria: Secondary | ICD-10-CM

## 2016-09-04 DIAGNOSIS — R1011 Right upper quadrant pain: Secondary | ICD-10-CM | POA: Diagnosis not present

## 2016-09-04 MED ORDER — TRIAMCINOLONE ACETONIDE 0.1 % EX CREA: 1.0000 "application " | TOPICAL_CREAM | Freq: Two times a day (BID) | CUTANEOUS | 0 refills | Status: AC

## 2016-09-04 MED ORDER — CEPHALEXIN 500 MG PO CAPS: 500.0000 mg | ORAL_CAPSULE | Freq: Three times a day (TID) | ORAL | 0 refills | Status: AC
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
## 2016-09-04 NOTE — Patient Instructions (Addendum)
Please take all new medication as prescribed - the antibiotic, and the cream  Please continue all other medications as before, and refills have been done if requested.  Please have the pharmacy call with any other refills you may need.  Please continue your efforts at being more active, low cholesterol diet, and weight control.  You are otherwise up to date with prevention measures today.  Please keep your appointments with your specialists as you may have planned  You will be contacted regarding the referral for: abdomen ultrasound  Please go to the LAB in the Basement (turn left off the elevator) for the tests to be done tomorrow  You will be contacted by phone if any changes need to be made immediately.  Otherwise, you will receive a letter about your results with an explanation, but please check with MyChart first.  Please remember to sign up for MyChart if you have not done so, as this will be important to you in the future with finding out test results, communicating by private email, and scheduling acute appointments online when needed.  Please return in 1 year for your yearly visit, or sooner if needed, with Lab testing done 3-5 days before

## 2016-09-05 ENCOUNTER — Other Ambulatory Visit: Payer: Self-pay | Admitting: Internal Medicine

## 2016-09-05 ENCOUNTER — Other Ambulatory Visit (INDEPENDENT_AMBULATORY_CARE_PROVIDER_SITE_OTHER): Payer: BLUE CROSS/BLUE SHIELD

## 2016-09-05 ENCOUNTER — Telehealth: Payer: Self-pay

## 2016-09-05 DIAGNOSIS — Z0001 Encounter for general adult medical examination with abnormal findings: Secondary | ICD-10-CM

## 2016-09-05 DIAGNOSIS — R300 Dysuria: Secondary | ICD-10-CM

## 2016-09-05 DIAGNOSIS — R1011 Right upper quadrant pain: Secondary | ICD-10-CM

## 2016-09-05 LAB — LIPID PANEL
CHOL/HDL RATIO: 7
Cholesterol: 233 mg/dL — ABNORMAL HIGH (ref 0–200)
HDL: 32.9 mg/dL — ABNORMAL LOW (ref >39.00)
LDL Cholesterol: 166 mg/dL — ABNORMAL HIGH (ref 0–99)
NONHDL: 200.36
Triglycerides: 174 mg/dL — ABNORMAL HIGH (ref 0.0–149.0)
VLDL: 34.8 mg/dL (ref 0.0–40.0)

## 2016-09-05 LAB — URINALYSIS, ROUTINE W REFLEX MICROSCOPIC
Bilirubin Urine: NEGATIVE
Hgb urine dipstick: NEGATIVE
KETONES UR: NEGATIVE
Leukocytes, UA: NEGATIVE
Nitrite: NEGATIVE
PH: 6.5 (ref 5.0–8.0)
RBC / HPF: NONE SEEN (ref 0–?)
SPECIFIC GRAVITY, URINE: 1.02 (ref 1.000–1.030)
Total Protein, Urine: NEGATIVE
UROBILINOGEN UA: 1 (ref 0.0–1.0)
Urine Glucose: NEGATIVE

## 2016-09-05 LAB — POCT URINALYSIS DIPSTICK
Bilirubin, UA: NEGATIVE
Blood, UA: NEGATIVE
Glucose, UA: NEGATIVE
KETONES UA: NEGATIVE
LEUKOCYTES UA: NEGATIVE
Nitrite, UA: NEGATIVE
PROTEIN UA: NEGATIVE
Spec Grav, UA: 1.025 (ref 1.010–1.025)
UROBILINOGEN UA: 0.2 U/dL
pH, UA: 6 (ref 5.0–8.0)

## 2016-09-05 LAB — BASIC METABOLIC PANEL
BUN: 12 mg/dL (ref 6–23)
CHLORIDE: 104 meq/L (ref 96–112)
CO2: 29 mEq/L (ref 19–32)
Calcium: 9.8 mg/dL (ref 8.4–10.5)
Creatinine, Ser: 1.17 mg/dL (ref 0.40–1.50)
GFR: 86.05 mL/min (ref >60.00)
Glucose, Bld: 92 mg/dL (ref 70–99)
POTASSIUM: 4.7 meq/L (ref 3.5–5.1)
SODIUM: 141 meq/L (ref 135–145)

## 2016-09-05 LAB — CBC WITH DIFFERENTIAL/PLATELET
BASOS ABS: 0.1 10*3/uL (ref 0.0–0.1)
Basophils Relative: 1 % (ref 0.0–3.0)
EOS ABS: 0.3 10*3/uL (ref 0.0–0.7)
Eosinophils Relative: 3.2 % (ref 0.0–5.0)
HCT: 46.1 % (ref 39.0–52.0)
Hemoglobin: 15.9 g/dL (ref 13.0–17.0)
LYMPHS ABS: 2.6 10*3/uL (ref 0.7–4.0)
Lymphocytes Relative: 31.7 % (ref 12.0–46.0)
MCHC: 34.6 g/dL (ref 30.0–36.0)
MCV: 87 fl (ref 78.0–100.0)
MONOS PCT: 8 % (ref 3.0–12.0)
Monocytes Absolute: 0.7 10*3/uL (ref 0.1–1.0)
NEUTROS ABS: 4.7 10*3/uL (ref 1.4–7.7)
NEUTROS PCT: 56.1 % (ref 43.0–77.0)
PLATELETS: 243 10*3/uL (ref 150.0–400.0)
RBC: 5.3 Mil/uL (ref 4.22–5.81)
RDW: 14.3 % (ref 11.5–15.5)
WBC: 8.3 10*3/uL (ref 4.0–10.5)

## 2016-09-05 LAB — HEPATIC FUNCTION PANEL
ALK PHOS: 57 U/L (ref 39–117)
ALT: 14 U/L (ref 0–53)
AST: 17 U/L (ref 0–37)
Albumin: 4.4 g/dL (ref 3.5–5.2)
BILIRUBIN DIRECT: 0.1 mg/dL (ref 0.0–0.3)
TOTAL PROTEIN: 7.4 g/dL (ref 6.0–8.3)
Total Bilirubin: 0.9 mg/dL (ref 0.2–1.2)

## 2016-09-05 LAB — TSH: TSH: 0.81 u[IU]/mL (ref 0.35–4.50)

## 2016-09-05 LAB — PSA: PSA: 0.51 ng/mL (ref 0.10–4.00)

## 2016-09-05 MED ORDER — ROSUVASTATIN CALCIUM 20 MG PO TABS: 20.0000 mg | ORAL_TABLET | Freq: Every day | ORAL | 3 refills | Status: DC

## 2016-09-05 NOTE — Telephone Encounter (Signed)
-----   Message from Corwin LevinsJames W John, MD sent at 09/05/2016  1:20 PM EDT ----- Left message on MyChart, pt to cont same tx except  The test results show that your current treatment is OK, except the LDL cholesterol is very high.  Please start a statin called Crestor (generic) at 20 mg per day, to help reduce the risk of heart disease and stroke.Lendell Caprice.    Joeanne Robicheaux to please inform pt, I will do rx

## 2016-09-05 NOTE — Assessment & Plan Note (Signed)
Etiology unclear, for labs as ordered, and abd ultrasound,  to f/u any worsening symptoms or concerns

## 2016-09-05 NOTE — Assessment & Plan Note (Signed)

## 2016-09-05 NOTE — Assessment & Plan Note (Addendum)
Etiology unclear, exam benign, for empiric antbx and urine studies  In addition to the time spent performing CPE, I spent an additional 15 minutes face to face,in which greater than 50% of this time was spent in counseling and coordination of care for patient's illness as documented, including the differential dx, tx, further evaluation and other management of dysuria possible UTI, RUQ pain, and eczema rash
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
## 2016-09-05 NOTE — Assessment & Plan Note (Signed)
Mild to mod, for topical steroid tx asd,  to f/u any worsening symptoms or concerns

## 2016-09-06 ENCOUNTER — Other Ambulatory Visit: Payer: BLUE CROSS/BLUE SHIELD

## 2016-09-06 LAB — URINE CULTURE: Organism ID, Bacteria: NO GROWTH

## 2016-09-14 ENCOUNTER — Other Ambulatory Visit: Payer: Self-pay | Admitting: Internal Medicine

## 2016-09-14 ENCOUNTER — Ambulatory Visit
Admission: RE | Admit: 2016-09-14 | Discharge: 2016-09-14 | Disposition: A | Discharge status: Home / Self Care | Payer: BLUE CROSS/BLUE SHIELD | Source: Ambulatory Visit | Attending: Internal Medicine | Admitting: Internal Medicine

## 2016-09-14 DIAGNOSIS — R1011 Right upper quadrant pain: Secondary | ICD-10-CM

## 2016-09-14 DIAGNOSIS — K8050 Calculus of bile duct without cholangitis or cholecystitis without obstruction: Secondary | ICD-10-CM

## 2016-09-18 ENCOUNTER — Other Ambulatory Visit: Payer: Self-pay | Admitting: Internal Medicine

## 2016-09-19 ENCOUNTER — Other Ambulatory Visit: Payer: Self-pay | Admitting: Internal Medicine

## 2016-09-21 ENCOUNTER — Ambulatory Visit: Payer: Self-pay | Admitting: General Surgery

## 2016-11-02 NOTE — Patient Instructions (Signed)
Mark GriffonDavid L Adams  11/02/2016   Your procedure is scheduled on: Thursday, Nov. 1, 2018   [facility]   [place]  elevators to 3rd floor to  Short Stay Center at  8:00 AM.    Call this number if you have problems the morning of surgery [phone]    Remember: ONLY 1 PERSON MAY GO WITH YOU TO SHORT STAY TO GET  READY MORNING OF YOUR SURGERY.   Do not eat food or drink liquids :After Midnight.    Take these medicines the morning of surgery with A SIP OF WATER: Escitalopram (Lexapro), Pantoprazole (Protonix) Rosuvastatin (Crestor)                               You may not have any metal on your body including hair pins, jewelry, and  piercings             Do not wear lotions, powders or perfumes, deodorant              Men may shave face and neck.   Do not bring valuables to the hospital. [facility] IS NOT             RESPONSIBLE   FOR VALUABLES.   Contacts, dentures or bridgework may not be worn into surgery.    Patients discharged the day of surgery will not be allowed to drive home.   Name and phone number of your driver:  Special Instructions: N/A              Please read over the following fact sheets you were given: _____________________________________________________________________             [facility] - Preparing for [facility] Before surgery, you can play an important role.  Because skin is not sterile, your skin needs to be as free of germs as possible.  You can reduce the number of germs on your skin by washing with CHG (chlorahexidine gluconate) soap before surgery.  CHG is an antiseptic cleaner which kills germs and bonds with the skin to continue killing germs even after washing. Please DO NOT use if you have an allergy to CHG or antibacterial soaps.  If your skin becomes reddened/irritated stop using the CHG and inform your nurse when you arrive at Short Stay. Do not shave (including legs and underarms) for at  least 48 hours prior to the first CHG shower.  You may shave your face/neck.  Please follow these instructions carefully:  1.  Shower with CHG Soap the night before surgery and the  morning of Surgery.  2.  If you choose to wash your hair, wash your hair first as usual with your  normal  shampoo.  3.  After you shampoo, rinse your hair and body thoroughly to remove the  shampoo.                             4.  Use CHG as you would any other liquid soap.  You can apply chg directly  to the skin and wash                       Gently with a scrungie or clean washcloth.  5.  Apply the CHG Soap to  your body ONLY FROM THE NECK DOWN.   Do not use on face/ open                           Wound or open sores. Avoid contact with eyes, ears mouth and genitals (private parts).                       Wash face,  Genitals (private parts) with your normal soap.             6.  Wash thoroughly, paying special attention to the area where your surgery  will be performed.  7.  Thoroughly rinse your body with warm water from the neck down.  8.  DO NOT shower/wash with your normal soap after using and rinsing off  the CHG Soap.                9.  Pat yourself dry with a clean towel.            10.  Wear clean pajamas.            11.  Place clean sheets on your bed the night of your first shower and do not  sleep with pets. Day of Surgery : Do not apply any lotions/deodorants the morning of surgery.  Please wear clean clothes to the hospital/surgery center.  FAILURE TO FOLLOW THESE INSTRUCTIONS MAY RESULT IN THE CANCELLATION OF YOUR SURGERY  PATIENT SIGNATURE_________________________________  NURSE SIGNATURE__________________________________  ________________________________________________________________________   Mark Adams  An incentive spirometer is a tool that can help keep your lungs clear and active. This tool measures how well you are filling your lungs with each breath. Taking long deep  breaths may help reverse or decrease the chance of developing breathing (pulmonary) problems (especially infection) following:  A long period of time when you are unable to move or be active. BEFORE THE PROCEDURE   If the spirometer includes an indicator to show your best effort, your nurse or respiratory therapist will set it to a desired goal.  If possible, sit up straight or lean slightly forward. Try not to slouch.  Hold the incentive spirometer in an upright position. INSTRUCTIONS FOR USE  1. Sit on the edge of your bed if possible, or sit up as far as you can in bed or on a chair. 2. Hold the incentive spirometer in an upright position. 3. Breathe out normally. 4. Place the mouthpiece in your mouth and seal your lips tightly around it. 5. Breathe in slowly and as deeply as possible, raising the piston or the ball toward the top of the column. 6. Hold your breath for 3-5 seconds or for as long as possible. Allow the piston or ball to fall to the bottom of the column. 7. Remove the mouthpiece from your mouth and breathe out normally. 8. Rest for a few seconds and repeat Steps 1 through 7 at least 10 times every 1-2 hours when you are awake. Take your time and take a few normal breaths between deep breaths. 9. The spirometer may include an indicator to show your best effort. Use the indicator as a goal to work toward during each repetition. 10. After each set of 10 deep breaths, practice coughing to be sure your lungs are clear. If you have an incision (the cut made at the time of surgery), support your incision when coughing by placing a pillow or rolled up towels firmly  against it. Once you are able to get out of bed, walk around indoors and cough well. You may stop using the incentive spirometer when instructed by your caregiver.  RISKS AND COMPLICATIONS  Take your time so you do not get dizzy or light-headed.  If you are in pain, you may need to take or ask for pain medication before  doing incentive spirometry. It is harder to take a deep breath if you are having pain. AFTER USE  Rest and breathe slowly and easily.  It can be helpful to keep track of a log of your progress. Your caregiver can provide you with a simple table to help with this. If you are using the spirometer at home, follow these instructions: SEEK MEDICAL CARE IF:   You are having difficultly using the spirometer.  You have trouble using the spirometer as often as instructed.  Your pain medication is not giving enough relief while using the spirometer.  You develop fever of 100.5 F (38.1 C) or higher. SEEK IMMEDIATE MEDICAL CARE IF:   You cough up bloody sputum that had not been present before.  You develop fever of 102 F (38.9 C) or greater.  You develop worsening pain at or near the incision site. MAKE SURE YOU:   Understand these instructions.  Will watch your condition.  Will get help right away if you are not doing well or get worse. Document Released: 05/07/2006 Document Revised: 03/19/2011 Document Reviewed: 07/08/2006 [facility] Patient Information 2014 [place], [place].   ________________________________________________________________________

## 2016-11-02 NOTE — Pre-Procedure Instructions (Signed)
The following are in epic: Last office visit note 09/05/16 US abd. 09/14/16

## 2016-11-06 ENCOUNTER — Encounter (HOSPITAL_COMMUNITY)
Admission: RE | Admit: 2016-11-06 | Discharge: 2016-11-06 | Disposition: A | Discharge status: Home / Self Care | Payer: BLUE CROSS/BLUE SHIELD | Source: Ambulatory Visit | Attending: General Surgery | Admitting: General Surgery

## 2016-12-21 NOTE — Progress Notes (Signed)
Please place orders in epic pt. Has a preop on 12-24-16 at 1000am ! Thank You very much!!

## 2016-12-21 NOTE — Progress Notes (Signed)
LOV DR. Fayrene FearingJAMES Epic 09-05-16  US ABD 09-14-16 EPIC

## 2016-12-21 NOTE — Patient Instructions (Signed)
Mark GriffonDavid L Adams  12/21/2016   Your procedure is scheduled on: 12-27-16  [facility]  [facility] Take [place]  elevators to 3rd floor to  Short Stay Center at    0530 AM.    Call this number if you have problems the morning of surgery [phone]     Remember: ONLY 1 PERSON MAY GO WITH YOU TO SHORT STAY TO GET  READY MORNING OF YOUR SURGERY.  Do not eat food or drink liquids :After Midnight.     Take these medicines the morning of surgery with A SIP OF WATER: lexapro, protonix, crestor                                You may not have any metal on your body including hair pins and              piercings  Do not wear jewelry, , lotions, powders or perfumes, deodorant                      Men may shave face and neck.   Do not bring valuables to the hospital. [facility] IS NOT             RESPONSIBLE   FOR VALUABLES.  Contacts, dentures or bridgework may not be worn into surgery.       Patients discharged the day of surgery will not be allowed to drive home.  Name and phone number of your driver:  Special Instructions: N/A              Please read over the following fact sheets you were given: _____________________________________________________________________           [facility] - Preparing for Surgery Before surgery, you can play an important role.  Because skin is not sterile, your skin needs to be as free of germs as possible.  You can reduce the number of germs on your skin by washing with CHG (chlorahexidine gluconate) soap before surgery.  CHG is an antiseptic cleaner which kills germs and bonds with the skin to continue killing germs even after washing. Please DO NOT use if you have an allergy to CHG or antibacterial soaps.  If your skin becomes reddened/irritated stop using the CHG and inform your nurse when you arrive at Short Stay. Do not shave (including legs and underarms) for at least 48 hours prior to the first CHG shower.  You  may shave your face/neck. Please follow these instructions carefully:  1.  Shower with CHG Soap the night before surgery and the  morning of Surgery.  2.  If you choose to wash your hair, wash your hair first as usual with your  normal  shampoo.  3.  After you shampoo, rinse your hair and body thoroughly to remove the  shampoo.                           4.  Use CHG as you would any other liquid soap.  You can apply chg directly  to the skin and wash                       Gently with a scrungie or clean washcloth.  5.  Apply the CHG Soap  to your body ONLY FROM THE NECK DOWN.   Do not use on face/ open                           Wound or open sores. Avoid contact with eyes, ears mouth and genitals (private parts).                       Wash face,  Genitals (private parts) with your normal soap.             6.  Wash thoroughly, paying special attention to the area where your surgery  will be performed.  7.  Thoroughly rinse your body with warm water from the neck down.  8.  DO NOT shower/wash with your normal soap after using and rinsing off  the CHG Soap.                9.  Pat yourself dry with a clean towel.            10.  Wear clean pajamas.            11.  Place clean sheets on your bed the night of your first shower and do not  sleep with pets. Day of Surgery : Do not apply any lotions/deodorants the morning of surgery.  Please wear clean clothes to the hospital/surgery center.  FAILURE TO FOLLOW THESE INSTRUCTIONS MAY RESULT IN THE CANCELLATION OF YOUR SURGERY PATIENT SIGNATURE_________________________________  NURSE SIGNATURE__________________________________  ________________________________________________________________________

## 2016-12-24 ENCOUNTER — Encounter (HOSPITAL_COMMUNITY): Payer: Self-pay

## 2016-12-24 ENCOUNTER — Encounter (INDEPENDENT_AMBULATORY_CARE_PROVIDER_SITE_OTHER): Payer: Self-pay

## 2016-12-24 ENCOUNTER — Encounter (HOSPITAL_COMMUNITY)
Admission: RE | Admit: 2016-12-24 | Discharge: 2016-12-24 | Disposition: A | Discharge status: Home / Self Care | Payer: BLUE CROSS/BLUE SHIELD | Source: Ambulatory Visit | Attending: General Surgery | Admitting: General Surgery

## 2016-12-24 ENCOUNTER — Other Ambulatory Visit: Payer: Self-pay

## 2016-12-24 DIAGNOSIS — R1011 Right upper quadrant pain: Secondary | ICD-10-CM | POA: Diagnosis not present

## 2016-12-24 DIAGNOSIS — K219 Gastro-esophageal reflux disease without esophagitis: Secondary | ICD-10-CM | POA: Diagnosis not present

## 2016-12-24 DIAGNOSIS — M6281 Muscle weakness (generalized): Secondary | ICD-10-CM | POA: Diagnosis not present

## 2016-12-24 DIAGNOSIS — Z01812 Encounter for preprocedural laboratory examination: Secondary | ICD-10-CM | POA: Diagnosis not present

## 2016-12-24 HISTORY — DX: Gastro-esophageal reflux disease without esophagitis: K21.9

## 2016-12-24 LAB — COMPREHENSIVE METABOLIC PANEL
ALBUMIN: 4.2 g/dL (ref 3.5–5.0)
ALK PHOS: 65 U/L (ref 38–126)
ALT: 18 U/L (ref 17–63)
ANION GAP: 6 (ref 5–15)
AST: 25 U/L (ref 15–41)
BUN: 16 mg/dL (ref 6–20)
CALCIUM: 9.2 mg/dL (ref 8.9–10.3)
CHLORIDE: 104 mmol/L (ref 101–111)
CO2: 26 mmol/L (ref 22–32)
Creatinine, Ser: 1.04 mg/dL (ref 0.61–1.24)
GFR calc Af Amer: >60 mL/min (ref >60)
GFR calc non Af Amer: >60 mL/min (ref >60)
GLUCOSE: 96 mg/dL (ref 65–99)
Potassium: 4.2 mmol/L (ref 3.5–5.1)
SODIUM: 136 mmol/L (ref 135–145)
Total Bilirubin: 1 mg/dL (ref 0.3–1.2)
Total Protein: 7.5 g/dL (ref 6.5–8.1)

## 2016-12-24 LAB — CBC
HCT: 41.5 % (ref 39.0–52.0)
HEMOGLOBIN: 15.2 g/dL (ref 13.0–17.0)
MCH: 29.8 pg (ref 26.0–34.0)
MCHC: 36.6 g/dL — AB (ref 30.0–36.0)
MCV: 81.4 fL (ref 78.0–100.0)
Platelets: 260 10*3/uL (ref 150–400)
RBC: 5.1 MIL/uL (ref 4.22–5.81)
RDW: 13.9 % (ref 11.5–15.5)
WBC: 7.9 10*3/uL (ref 4.0–10.5)

## 2016-12-26 ENCOUNTER — Ambulatory Visit: Payer: Self-pay | Admitting: General Surgery

## 2016-12-26 NOTE — Anesthesia Preprocedure Evaluation (Addendum)
Anesthesia Evaluation  Patient identified by MRN, date of birth, ID band Patient awake    Reviewed: Allergy & Precautions, H&P , Patient's Chart, lab work & pertinent test results, reviewed documented beta blocker date and time   Airway Mallampati: II  TM Distance: >3 FB Neck ROM: full    Dental no notable dental hx. (+) Dental Advisory Given   Pulmonary    Pulmonary exam normal breath sounds clear to auscultation       Cardiovascular  Rhythm:regular Rate:Normal     Neuro/Psych    GI/Hepatic   Endo/Other    Renal/GU      Musculoskeletal   Abdominal   Peds  Hematology   Anesthesia Other Findings   Reproductive/Obstetrics                            Anesthesia Physical Anesthesia Plan  ASA: II  Anesthesia Plan: General   Post-op Pain Management:    Induction: Intravenous  PONV Risk Score and Plan: 2 and Dexamethasone, Ondansetron and Treatment may vary due to age or medical condition  Airway Management Planned: Oral ETT  Additional Equipment:   Intra-op Plan:   Post-operative Plan: Extubation in OR  Informed Consent: I have reviewed the patients History and Physical, chart, labs and discussed the procedure including the risks, benefits and alternatives for the proposed anesthesia with the patient or authorized representative who has indicated his/her understanding and acceptance.   Dental Advisory Given  Plan Discussed with: CRNA and Surgeon  Anesthesia Plan Comments: (  )        Anesthesia Quick Evaluation

## 2016-12-27 ENCOUNTER — Encounter (HOSPITAL_COMMUNITY)
Admission: RE | Disposition: A | Discharge status: Home / Self Care | Payer: Self-pay | Source: Ambulatory Visit | Attending: General Surgery

## 2016-12-27 ENCOUNTER — Encounter (HOSPITAL_COMMUNITY): Payer: Self-pay | Admitting: *Deleted

## 2016-12-27 ENCOUNTER — Observation Stay (HOSPITAL_COMMUNITY)
Admission: RE | Admit: 2016-12-27 | Discharge: 2016-12-28 | Disposition: A | Discharge status: Home / Self Care | Payer: BLUE CROSS/BLUE SHIELD | Source: Ambulatory Visit | Attending: General Surgery | Admitting: General Surgery

## 2016-12-27 ENCOUNTER — Other Ambulatory Visit: Payer: Self-pay

## 2016-12-27 ENCOUNTER — Ambulatory Visit (HOSPITAL_COMMUNITY): Payer: BLUE CROSS/BLUE SHIELD | Admitting: Anesthesiology

## 2016-12-27 DIAGNOSIS — E785 Hyperlipidemia, unspecified: Secondary | ICD-10-CM | POA: Insufficient documentation

## 2016-12-27 DIAGNOSIS — F329 Major depressive disorder, single episode, unspecified: Secondary | ICD-10-CM | POA: Diagnosis not present

## 2016-12-27 DIAGNOSIS — Z79899 Other long term (current) drug therapy: Secondary | ICD-10-CM | POA: Diagnosis not present

## 2016-12-27 DIAGNOSIS — K589 Irritable bowel syndrome without diarrhea: Secondary | ICD-10-CM | POA: Diagnosis not present

## 2016-12-27 DIAGNOSIS — Z23 Encounter for immunization: Secondary | ICD-10-CM | POA: Insufficient documentation

## 2016-12-27 DIAGNOSIS — K8010 Calculus of gallbladder with chronic cholecystitis without obstruction: Secondary | ICD-10-CM | POA: Diagnosis not present

## 2016-12-27 DIAGNOSIS — K219 Gastro-esophageal reflux disease without esophagitis: Secondary | ICD-10-CM | POA: Insufficient documentation

## 2016-12-27 DIAGNOSIS — K811 Chronic cholecystitis: Secondary | ICD-10-CM | POA: Diagnosis present

## 2016-12-27 HISTORY — PX: CHOLECYSTECTOMY: SHX55

## 2016-12-27 SURGERY — LAPAROSCOPIC CHOLECYSTECTOMY
Anesthesia: General | Site: Abdomen

## 2016-12-27 MED ORDER — PROCHLORPERAZINE MALEATE 10 MG PO TABS: 10.0000 mg | ORAL_TABLET | Freq: Four times a day (QID) | ORAL | Status: DC | PRN

## 2016-12-27 MED ORDER — DIPHENHYDRAMINE HCL 25 MG PO CAPS: 25.0000 mg | ORAL_CAPSULE | Freq: Four times a day (QID) | ORAL | Status: DC | PRN

## 2016-12-27 MED ORDER — BUPIVACAINE-EPINEPHRINE (PF) 0.25% -1:200000 IJ SOLN
INTRAMUSCULAR | Status: AC
  Filled 2016-12-27: qty 30

## 2016-12-27 MED ORDER — DEXAMETHASONE SODIUM PHOSPHATE 10 MG/ML IJ SOLN
INTRAMUSCULAR | Status: DC | PRN
  Administered 2016-12-27: 10 mg via INTRAVENOUS

## 2016-12-27 MED ORDER — MIDAZOLAM HCL 5 MG/5ML IJ SOLN
INTRAMUSCULAR | Status: DC | PRN
  Administered 2016-12-27: 2 mg via INTRAVENOUS

## 2016-12-27 MED ORDER — SUGAMMADEX SODIUM 200 MG/2ML IV SOLN
INTRAVENOUS | Status: DC | PRN
  Administered 2016-12-27: 200 mg via INTRAVENOUS

## 2016-12-27 MED ORDER — FENTANYL CITRATE (PF) 100 MCG/2ML IJ SOLN
INTRAMUSCULAR | Status: DC | PRN
  Administered 2016-12-27: 25 ug via INTRAVENOUS
  Administered 2016-12-27 (×2): 50 ug via INTRAVENOUS
  Administered 2016-12-27: 25 ug via INTRAVENOUS

## 2016-12-27 MED ORDER — CELECOXIB 200 MG PO CAPS
200.0000 mg | ORAL_CAPSULE | ORAL | Status: AC
  Administered 2016-12-27: 200 mg via ORAL
  Filled 2016-12-27: qty 1

## 2016-12-27 MED ORDER — CHLORHEXIDINE GLUCONATE CLOTH 2 % EX PADS: 6.0000 | MEDICATED_PAD | Freq: Once | CUTANEOUS | Status: DC

## 2016-12-27 MED ORDER — LIDOCAINE HCL (CARDIAC) 20 MG/ML IV SOLN
INTRAVENOUS | Status: DC | PRN
  Administered 2016-12-27: 50 mg via INTRAVENOUS

## 2016-12-27 MED ORDER — PROPOFOL 10 MG/ML IV BOLUS
INTRAVENOUS | Status: AC
  Filled 2016-12-27: qty 40

## 2016-12-27 MED ORDER — SUGAMMADEX SODIUM 200 MG/2ML IV SOLN
INTRAVENOUS | Status: AC
  Filled 2016-12-27: qty 2

## 2016-12-27 MED ORDER — ONDANSETRON HCL 4 MG/2ML IJ SOLN
INTRAMUSCULAR | Status: AC
  Filled 2016-12-27: qty 2

## 2016-12-27 MED ORDER — PROCHLORPERAZINE EDISYLATE 5 MG/ML IJ SOLN: 5.0000 mg | Freq: Four times a day (QID) | INTRAMUSCULAR | Status: DC | PRN

## 2016-12-27 MED ORDER — ONDANSETRON 4 MG PO TBDP: 4.0000 mg | ORAL_TABLET | Freq: Four times a day (QID) | ORAL | Status: DC | PRN

## 2016-12-27 MED ORDER — HYDROCODONE-ACETAMINOPHEN 5-325 MG PO TABS
1.0000 | ORAL_TABLET | ORAL | Status: DC | PRN
  Administered 2016-12-28: 1 via ORAL
  Filled 2016-12-27: qty 1

## 2016-12-27 MED ORDER — ONDANSETRON HCL 4 MG/2ML IJ SOLN
INTRAMUSCULAR | Status: DC | PRN
  Administered 2016-12-27: 4 mg via INTRAVENOUS

## 2016-12-27 MED ORDER — DIPHENHYDRAMINE HCL 50 MG/ML IJ SOLN: 25.0000 mg | Freq: Four times a day (QID) | INTRAMUSCULAR | Status: DC | PRN

## 2016-12-27 MED ORDER — MIDAZOLAM HCL 2 MG/2ML IJ SOLN
INTRAMUSCULAR | Status: AC
  Filled 2016-12-27: qty 2

## 2016-12-27 MED ORDER — PROPOFOL 10 MG/ML IV BOLUS
INTRAVENOUS | Status: DC | PRN
  Administered 2016-12-27: 170 mg via INTRAVENOUS

## 2016-12-27 MED ORDER — ESCITALOPRAM OXALATE 10 MG PO TABS
10.0000 mg | ORAL_TABLET | Freq: Every day | ORAL | Status: DC
  Administered 2016-12-28: 10 mg via ORAL
  Filled 2016-12-27: qty 1

## 2016-12-27 MED ORDER — TRAMADOL HCL 50 MG PO TABS: 50.0000 mg | ORAL_TABLET | Freq: Four times a day (QID) | ORAL | Status: DC | PRN

## 2016-12-27 MED ORDER — MORPHINE SULFATE (PF) 2 MG/ML IV SOLN: 2.0000 mg | INTRAVENOUS | Status: DC | PRN

## 2016-12-27 MED ORDER — LACTATED RINGERS IR SOLN
Status: DC | PRN
  Administered 2016-12-27: 1000 mL

## 2016-12-27 MED ORDER — FENTANYL CITRATE (PF) 250 MCG/5ML IJ SOLN
INTRAMUSCULAR | Status: AC
  Filled 2016-12-27: qty 5

## 2016-12-27 MED ORDER — CEFOTETAN DISODIUM-DEXTROSE 2-2.08 GM-%(50ML) IV SOLR
2.0000 g | INTRAVENOUS | Status: AC
  Administered 2016-12-27: 2 g via INTRAVENOUS

## 2016-12-27 MED ORDER — IBUPROFEN 800 MG PO TABS: 800.0000 mg | ORAL_TABLET | Freq: Three times a day (TID) | ORAL | 0 refills | Status: DC | PRN

## 2016-12-27 MED ORDER — EPHEDRINE SULFATE 50 MG/ML IJ SOLN
INTRAMUSCULAR | Status: DC | PRN
  Administered 2016-12-27: 7 mg via INTRAVENOUS

## 2016-12-27 MED ORDER — 0.9 % SODIUM CHLORIDE (POUR BTL) OPTIME
TOPICAL | Status: DC | PRN
  Administered 2016-12-27: 1000 mL

## 2016-12-27 MED ORDER — ROCURONIUM BROMIDE 100 MG/10ML IV SOLN
INTRAVENOUS | Status: DC | PRN
  Administered 2016-12-27: 5 mg via INTRAVENOUS
  Administered 2016-12-27: 10 mg via INTRAVENOUS
  Administered 2016-12-27: 50 mg via INTRAVENOUS

## 2016-12-27 MED ORDER — DEXAMETHASONE SODIUM PHOSPHATE 10 MG/ML IJ SOLN
INTRAMUSCULAR | Status: AC
  Filled 2016-12-27: qty 1

## 2016-12-27 MED ORDER — HYDROMORPHONE HCL 1 MG/ML IJ SOLN: 0.2500 mg | INTRAMUSCULAR | Status: DC | PRN

## 2016-12-27 MED ORDER — ACETAMINOPHEN 500 MG PO TABS
1000.0000 mg | ORAL_TABLET | ORAL | Status: AC
  Administered 2016-12-27: 1000 mg via ORAL
  Filled 2016-12-27: qty 2

## 2016-12-27 MED ORDER — PANTOPRAZOLE SODIUM 40 MG PO TBEC
40.0000 mg | DELAYED_RELEASE_TABLET | Freq: Every day | ORAL | Status: DC
  Administered 2016-12-27 – 2016-12-28 (×2): 40 mg via ORAL
  Filled 2016-12-27 (×2): qty 1

## 2016-12-27 MED ORDER — SUGAMMADEX SODIUM 500 MG/5ML IV SOLN
INTRAVENOUS | Status: AC
  Filled 2016-12-27: qty 5

## 2016-12-27 MED ORDER — INFLUENZA VAC SPLIT QUAD 0.5 ML IM SUSY
0.5000 mL | PREFILLED_SYRINGE | INTRAMUSCULAR | Status: AC
  Administered 2016-12-28: 0.5 mL via INTRAMUSCULAR
  Filled 2016-12-27: qty 0.5

## 2016-12-27 MED ORDER — ONDANSETRON HCL 4 MG/2ML IJ SOLN: 4.0000 mg | Freq: Four times a day (QID) | INTRAMUSCULAR | Status: DC | PRN

## 2016-12-27 MED ORDER — PROMETHAZINE HCL 25 MG/ML IJ SOLN
INTRAMUSCULAR | Status: AC
  Filled 2016-12-27: qty 1

## 2016-12-27 MED ORDER — HYDRALAZINE HCL 20 MG/ML IJ SOLN: 10.0000 mg | INTRAMUSCULAR | Status: DC | PRN

## 2016-12-27 MED ORDER — SODIUM CHLORIDE 0.9 % IV SOLN
INTRAVENOUS | Status: DC
  Administered 2016-12-27: 15:00:00 via INTRAVENOUS

## 2016-12-27 MED ORDER — HYDROCODONE-ACETAMINOPHEN 5-325 MG PO TABS: 1.0000 | ORAL_TABLET | Freq: Four times a day (QID) | ORAL | 0 refills | Status: DC | PRN

## 2016-12-27 MED ORDER — GABAPENTIN 300 MG PO CAPS
300.0000 mg | ORAL_CAPSULE | ORAL | Status: AC
  Administered 2016-12-27: 300 mg via ORAL
  Filled 2016-12-27: qty 1

## 2016-12-27 MED ORDER — ONDANSETRON HCL 4 MG/2ML IJ SOLN
4.0000 mg | INTRAMUSCULAR | Status: AC
  Administered 2016-12-27: 4 mg via INTRAVENOUS

## 2016-12-27 MED ORDER — BUPIVACAINE-EPINEPHRINE 0.25% -1:200000 IJ SOLN
INTRAMUSCULAR | Status: DC | PRN
  Administered 2016-12-27: 20 mL

## 2016-12-27 MED ORDER — EPHEDRINE 5 MG/ML INJ
INTRAVENOUS | Status: AC
  Filled 2016-12-27: qty 10

## 2016-12-27 MED ORDER — PROMETHAZINE HCL 25 MG/ML IJ SOLN
6.2500 mg | INTRAMUSCULAR | Status: AC | PRN
  Administered 2016-12-27 (×2): 6.25 mg via INTRAVENOUS

## 2016-12-27 MED ORDER — KETOROLAC TROMETHAMINE 30 MG/ML IJ SOLN
30.0000 mg | Freq: Four times a day (QID) | INTRAMUSCULAR | Status: DC | PRN
Start: 2016-12-27 — End: 2016-12-28

## 2016-12-27 MED ORDER — LACTATED RINGERS IV SOLN
INTRAVENOUS | Status: DC | PRN
  Administered 2016-12-27 (×2): via INTRAVENOUS

## 2016-12-27 MED ORDER — ROCURONIUM BROMIDE 50 MG/5ML IV SOSY
PREFILLED_SYRINGE | INTRAVENOUS | Status: AC
  Filled 2016-12-27: qty 5

## 2016-12-27 MED ORDER — CEFOTETAN DISODIUM-DEXTROSE 2-2.08 GM-%(50ML) IV SOLR
INTRAVENOUS | Status: AC
  Filled 2016-12-27: qty 50

## 2016-12-27 SURGICAL SUPPLY — 44 items
APL SKNCLS STERI-STRIP NONHPOA (GAUZE/BANDAGES/DRESSINGS) ×1
APPLIER CLIP ROT 10 11.4 M/L (STAPLE)
APR CLP MED LRG 11.4X10 (STAPLE)
BAG SPEC RTRVL 10 TROC 200 (ENDOMECHANICALS) ×1
BANDAGE ADH SHEER 1  50/CT (GAUZE/BANDAGES/DRESSINGS) ×15 IMPLANT
BENZOIN TINCTURE PRP APPL 2/3 (GAUZE/BANDAGES/DRESSINGS) ×3 IMPLANT
CABLE HIGH FREQUENCY MONO STRZ (ELECTRODE) ×3 IMPLANT
CATH CHOLANG 76X19 KUMAR (CATHETERS) IMPLANT
CHLORAPREP W/TINT 26ML (MISCELLANEOUS) ×3 IMPLANT
CLIP APPLIE ROT 10 11.4 M/L (STAPLE) IMPLANT
CLIP VESOLOCK LG 6/CT PURPLE (CLIP) ×4 IMPLANT
CLIP VESOLOCK XL 6/CT (CLIP) IMPLANT
CLOSURE WOUND 1/2 X4 (GAUZE/BANDAGES/DRESSINGS) ×1
COVER MAYO STAND STRL (DRAPES) IMPLANT
COVER SURGICAL LIGHT HANDLE (MISCELLANEOUS) ×3 IMPLANT
DECANTER SPIKE VIAL GLASS SM (MISCELLANEOUS) ×3 IMPLANT
DRAIN CHANNEL 19F RND (DRAIN) IMPLANT
DRAPE C-ARM 42X120 X-RAY (DRAPES) IMPLANT
EVACUATOR SILICONE 100CC (DRAIN) IMPLANT
GLOVE BIOGEL PI IND STRL 7.0 (GLOVE) ×1 IMPLANT
GLOVE BIOGEL PI INDICATOR 7.0 (GLOVE) ×2
GLOVE SURG SS PI 7.0 STRL IVOR (GLOVE) ×3 IMPLANT
GOWN STRL REUS W/TWL LRG LVL3 (GOWN DISPOSABLE) ×3 IMPLANT
GOWN STRL REUS W/TWL XL LVL3 (GOWN DISPOSABLE) ×6 IMPLANT
GRASPER SUT TROCAR 14GX15 (MISCELLANEOUS) ×3 IMPLANT
IRRIG SUCT STRYKERFLOW 2 WTIP (MISCELLANEOUS) ×3
IRRIGATION SUCT STRKRFLW 2 WTP (MISCELLANEOUS) ×1 IMPLANT
KIT BASIN OR (CUSTOM PROCEDURE TRAY) ×3 IMPLANT
POUCH RETRIEVAL ECOSAC 10 (ENDOMECHANICALS) ×1 IMPLANT
POUCH RETRIEVAL ECOSAC 10MM (ENDOMECHANICALS) ×2
SCISSORS LAP 5X35 DISP (ENDOMECHANICALS) ×3 IMPLANT
SHEARS HARMONIC ACE PLUS 36CM (ENDOMECHANICALS) IMPLANT
SLEEVE XCEL OPT CAN 5 100 (ENDOMECHANICALS) ×6 IMPLANT
STOPCOCK 4 WAY LG BORE MALE ST (IV SETS) ×3 IMPLANT
STRIP CLOSURE SKIN 1/2X4 (GAUZE/BANDAGES/DRESSINGS) ×2 IMPLANT
SUT ETHILON 2 0 PS N (SUTURE) IMPLANT
SUT MNCRL AB 4-0 PS2 18 (SUTURE) ×3 IMPLANT
SUT VICRYL 0 ENDOLOOP (SUTURE) IMPLANT
TOWEL OR 17X26 10 PK STRL BLUE (TOWEL DISPOSABLE) ×3 IMPLANT
TOWEL OR NON WOVEN STRL DISP B (DISPOSABLE) ×3 IMPLANT
TRAY LAPAROSCOPIC (CUSTOM PROCEDURE TRAY) ×3 IMPLANT
TROCAR BLADELESS OPT 5 100 (ENDOMECHANICALS) ×3 IMPLANT
TROCAR XCEL 12X100 BLDLESS (ENDOMECHANICALS) ×3 IMPLANT
TUBING INSUF HEATED (TUBING) ×3 IMPLANT
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
## 2016-12-27 NOTE — Progress Notes (Signed)
Spoke with Dr. Sheliah HatchKinsinger Via phone to update him on pt not having any nausea or pain.  Okay to discharge per Dr. Sheliah HatchKinsinger if pt is nausea free.    Discharge instructions discussed with pt and wife karen, both verbalize understanding of d/c instructions. Told to call office and/or go to ED for any emergent symptoms, questions or concerns.  Pt given ice pack.

## 2016-12-27 NOTE — Progress Notes (Addendum)
Report given to ParkerBeth, RN 5W. Beth Aware that pt still needs to urinate and pt is also aware that he still needs to urinate also.  Beth okay with wife holding on to prescriptions from Dr. Sheliah HatchKinsinger (vicodin and ibuprofen).

## 2016-12-27 NOTE — Op Note (Signed)
PATIENT:  Mark Adams  46 y.o. male  PRE-OPERATIVE DIAGNOSIS:  CHRONIC CHOLECYSTITIS   POST-OPERATIVE DIAGNOSIS:  CHRONIC CHOLECYSTITIS   PROCEDURE:  Procedure(s): LAPAROSCOPIC CHOLECYSTECTOMY AND LYSIS OF ADHESIONS Lysis of adhesions  SURGEON:  Surgeon(s): Tatiyanna Lashley, De BlanchLuke Aaron, MD  ASSISTANT: none  ANESTHESIA:   local and general  Indications for procedure: Mark GriffonDavid L Liou is a 46 y.o. male with symptoms of Abdominal pain consistent with gallbladder disease, Confirmed by Ultrasound.  Description of procedure: The patient was brought into the operative suite, placed supine. Anesthesia was administered with endotracheal tube. Patient was strapped in place and foot board was secured. All pressure points were offloaded by foam padding. The patient was prepped and draped in the usual sterile fashion.  A small incision was made in the right subcostal. A 5mm trocar was inserted into the peritoneal cavity with optical entry. Pneumoperitoneum was applied with high flow low pressure. On initial visualization there were multiple filmy adhesions of the omentum to the abdominal wall. 1 5mm trocar was placed through the right lateral space and sharp dissection was used to clear the midline. In doing so a steel wire suture came loose and was removed. 1 5mm trocar was placed in the periumbilical space. A 12mm trocar was placed in the subxiphoid space. Next the patient was placed in reverse trendelenberg. The gallbladder had multiple filmy adhesions to the transverse colon. Blunt dissection was able to create a plane.  The gallbladder was retracted cephalad and lateral. The peritoneum was reflected off the infundibulum working lateral to medial. The cystic duct and cystic artery were identified and further dissection revealed a critical view. The cystic duct and cystic artery were doubly clipped and ligated.   The gallbladder was removed off the liver bed with cautery. The Gallbladder was placed in a  specimen bag. The gallbladder fossa was irrigated and hemostasis was applied with cautery. The gallbladder was removed via the 12mm trocar. The fascial defect was closed with interrupted 0 vicryl suture via laparoscopic trans-fascial suture passer. Marcaine was infused into the tissue around the 12mm trocar. Pneumoperitoneum was removed, all trocar were removed. All incisions were closed with 4-0 monocryl subcuticular stitch. The patient woke from anesthesia and was brought to PACU in stable condition. All counts were correct  Findings: Multiple adhesions in the abdomen. Steel suture loose, removed. Normal ductal anatomy  Specimen: gallbladder  Blood loss: Total I/O In: -  Out: 10 [Blood:10] ml  Local anesthesia: 20 ml marcaine  Complications: none  PLAN OF CARE: Discharge to home after PACU  PATIENT DISPOSITION:  PACU - hemodynamically stable.  Feliciana RossettiLuke Rozella Servello, M.D. General, Bariatric, & Minimally Invasive Surgery [facility]

## 2016-12-27 NOTE — Anesthesia Procedure Notes (Signed)
Procedure Name: Intubation Date/Time: 12/27/2016 7:31 AM Performed by: Garrel Ridgel, CRNA Pre-anesthesia Checklist: Patient identified, Emergency Drugs available, Suction available, Patient being monitored and Timeout performed Patient Re-evaluated:Patient Re-evaluated prior to induction Oxygen Delivery Method: Circle system utilized Preoxygenation: Pre-oxygenation with 100% oxygen Induction Type: IV induction Ventilation: Mask ventilation without difficulty Laryngoscope Size: Mac and 4 Grade View: Grade II Tube type: Oral Number of attempts: 1 Airway Equipment and Method: Stylet Placement Confirmation: ETT inserted through vocal cords under direct vision,  positive ETCO2 and breath sounds checked- equal and bilateral Secured at: 23 cm Tube secured with: Tape Dental Injury: Teeth and Oropharynx as per pre-operative assessment

## 2016-12-27 NOTE — Transfer of Care (Signed)
Immediate Anesthesia Transfer of Care Note  Patient: Mark GriffonDavid L Jocelyn  Procedure(s) Performed: LAPAROSCOPIC CHOLECYSTECTOMY AND LYSIS OF ADHESIONS (N/A Abdomen)  Patient Location: PACU  Anesthesia Type:General  Level of Consciousness: awake, oriented and drowsy  Airway & Oxygen Therapy: Patient Spontanous Breathing and Patient connected to face mask oxygen  Post-op Assessment: Report given to RN and Post -op Vital signs reviewed and stable  Post vital signs: Reviewed and stable  Last Vitals:  Vitals:   12/27/16 0531  BP: (!) 135/96  Pulse: 72  Resp: 18  Temp: 36.6 C  SpO2: 99%    Last Pain:  Vitals:   12/27/16 0531  TempSrc: Oral      Patients Stated Pain Goal: 4 (12/27/16 0544)  Complications: No apparent anesthesia complications

## 2016-12-27 NOTE — Progress Notes (Signed)
Pt filled up emesis basin with emesis after our third attempt of standing up to urinate.  Pt has gotten very nauseated when attempting to change position and has had to immediately lay back down.    Dr. Sheliah HatchKinsinger notified of this and informed me that he would put in observation orders for pt to stay overnight.  Pt and wife Clydie BraunKaren informed of this and are both okay with this plan to manage nausea/vomiting.    Cold rag given to pt, pt is now feeling less nauseated, no pain at this time.

## 2016-12-27 NOTE — Progress Notes (Signed)
Dr. Sheliah HatchKinsinger made aware of patient's episodes of nausea and vomiting- medications received in O.R. And PACU- O.K. To take to Short Stay  - if patient continues to have nausea and vomiting notify Dr. Sheliah HatchKinsinger - per Dr. Sheliah HatchKinsinger

## 2016-12-27 NOTE — Anesthesia Postprocedure Evaluation (Signed)
Anesthesia Post Note  Patient: Marni GriffonDavid L Mangual  Procedure(s) Performed: LAPAROSCOPIC CHOLECYSTECTOMY AND LYSIS OF ADHESIONS (N/A Abdomen)     Patient location during evaluation: PACU Anesthesia Type: General Level of consciousness: awake and alert Pain management: pain level controlled Vital Signs Assessment: post-procedure vital signs reviewed and stable Respiratory status: spontaneous breathing, nonlabored ventilation, respiratory function stable and patient connected to nasal cannula oxygen Cardiovascular status: blood pressure returned to baseline and stable Postop Assessment: no apparent nausea or vomiting Anesthetic complications: no    Last Vitals:  Vitals:   12/27/16 1420 12/27/16 1500  BP: 118/79 122/74  Pulse: 72 70  Resp:  18  Temp: 36.5 C 36.8 C  SpO2: 99% 98%    Last Pain:  Vitals:   12/27/16 1500  TempSrc: Oral  PainSc:                  Jiles GarterJACKSON,Kael Keetch EDWARD

## 2016-12-27 NOTE — Discharge Instructions (Signed)
Cholecystostomy, Care After Refer to this sheet in the next few weeks. These instructions provide you with information about caring for yourself after your procedure. Your health care provider may also give you more specific instructions. Your treatment has been planned according to current medical practices, but problems sometimes occur. Call your health care provider if you have any problems or questions after your procedure. What can I expect after the procedure? After your procedure, it is common to have soreness near the site of your drainage tube (catheter) or your incision. Follow these instructions at home: Incision care  Follow instructions from your health care provider about how to take care of your incision. Make sure you: ? Wash your hands with soap and water before you change your bandage (dressing). If soap and water are not available, use hand sanitizer. ? Change your dressing as told by your health care provider. ? Leave stitches (sutures), skin glue, or adhesive strips in place. These skin closures may need to be in place for 2 weeks or longer. If adhesive strip edges start to loosen and curl up, you may trim the loose edges. Do not remove adhesive strips completely unless your health care provider tells you to do that.  Check your incision and your drainage site every day for signs of infection. Watch for: ? Redness, swelling, or pain. ? Fluid, blood, or pus. General instructions  If you were sent home with a surgical drain in place, follow instructions from your health care provider about how to care for your drain and collection bag at home.  Do not take baths, swim, or use a hot tub until your health care provider approves. Ask your health care provider if you can take showers. You may only be allowed to take sponge baths for bathing.  Follow instructions from your health care provider about what you may eat or drink.  Take over-the-counter and prescription medicines only  as told by your health care provider.  Keep all follow-up visits as told by your health care provider. This is important. Contact a health care provider if:  You have redness, swelling, or pain at your incision or drainage site.  You have nausea or vomiting. Get help right away if:  Your abdominal pain gets worse.  You feel dizzy or you faint while standing.  You have fluid, blood, or pus coming from your incision or drainage site.  You have a fever.  You have shortness of breath.  You have a rapid heartbeat.  Your nausea or vomiting does not go away.  Your drainage tube becomes blocked.  Your drainage tube comes out of your abdomen. This information is not intended to replace advice given to you by your health care provider. Make sure you discuss any questions you have with your health care provider. Document Released: 09/15/2014 Document Revised: 06/02/2015 Document Reviewed: 04/07/2014 Elsevier Interactive Patient Education  2018 [employer]  General Anesthesia, Adult, Care After These instructions provide you with information about caring for yourself after your procedure. Your health care provider may also give you more specific instructions. Your treatment has been planned according to current medical practices, but problems sometimes occur. Call your health care provider if you have any problems or questions after your procedure. What can I expect after the procedure? After the procedure, it is common to have:  Vomiting.  A sore throat.  Mental slowness.  It is common to feel:  Nauseous.  Cold or shivery.  Sleepy.  Tired.  Sore or  achy, even in parts of your body where you did not have surgery.  Follow these instructions at home: For at least 24 hours after the procedure:  Do not: ? Participate in activities where you could fall or become injured. ? Drive. ? Use heavy machinery. ? Drink alcohol. ? Take sleeping pills or medicines that cause  drowsiness. ? Make important decisions or sign legal documents. ? Take care of children on your own.  Rest. Eating and drinking  If you vomit, drink water, juice, or soup when you can drink without vomiting.  Drink enough fluid to keep your urine clear or pale yellow.  Make sure you have little or no nausea before eating solid foods.  Follow the diet recommended by your health care provider. General instructions  Have a responsible adult stay with you until you are awake and alert.  Return to your normal activities as told by your health care provider. Ask your health care provider what activities are safe for you.  Take over-the-counter and prescription medicines only as told by your health care provider.  If you smoke, do not smoke without supervision.  Keep all follow-up visits as told by your health care provider. This is important. Contact a health care provider if:  You continue to have nausea or vomiting at home, and medicines are not helpful.  You cannot drink fluids or start eating again.  You cannot urinate after 8-12 hours.  You develop a skin rash.  You have fever.  You have increasing redness at the site of your procedure. Get help right away if:  You have difficulty breathing.  You have chest pain.  You have unexpected bleeding.  You feel that you are having a life-threatening or urgent problem. This information is not intended to replace advice given to you by your health care provider. Make sure you discuss any questions you have with your health care provider. Document Released: 04/02/2000 Document Revised: 05/30/2015 Document Reviewed: 12/09/2014 Elsevier Interactive Patient Education  [employer]2018 Elsevier Inc.

## 2016-12-28 ENCOUNTER — Encounter (HOSPITAL_COMMUNITY): Payer: Self-pay | Admitting: General Surgery

## 2016-12-28 DIAGNOSIS — K8010 Calculus of gallbladder with chronic cholecystitis without obstruction: Secondary | ICD-10-CM | POA: Diagnosis not present

## 2016-12-28 MED ORDER — ONDANSETRON 4 MG PO TBDP: 4.0000 mg | ORAL_TABLET | Freq: Four times a day (QID) | ORAL | 0 refills | Status: DC | PRN

## 2016-12-28 NOTE — Discharge Summary (Signed)
Physician Discharge Summary  Mark Adams [id number] DOB: 03/19/1970 DOA: 12/27/2016  PCP: Corwin LevinsJohn, James W, MD  Admit date: 12/27/2016 Discharge date: 12/28/2016  Recommendations for Outpatient Follow-up:  1.  (include homehealth, outpatient follow-up instructions, specific recommendations for PCP to follow-up on, etc.)  Follow-up Information    Mardee Clune, De BlanchLuke Aaron, MD In 3 weeks.   Specialty:  General Surgery Contact information: [address] [place] [address] [phone]          Discharge Diagnoses:  Active Problems:   Chronic cholecystitis   Surgical Procedure: lap chole  Discharge Condition: Good Disposition: Home  Diet recommendation: low fat diet   Hospital Course:  46 yo male underwent lap chole. Post op he had multiple bouts of vomiting and was brought in for observation. Overnight he improved and was able to tolerate water and some food. He was discharged home POD 1.  Discharge Instructions  Discharge Instructions    Call MD for:  difficulty breathing, headache or visual disturbances   Complete by:  As directed    Call MD for:  hives   Complete by:  As directed    Call MD for:  persistant nausea and vomiting   Complete by:  As directed    Call MD for:  redness, tenderness, or signs of infection (pain, swelling, redness, odor or green/yellow discharge around incision site)   Complete by:  As directed    Call MD for:  severe uncontrolled pain   Complete by:  As directed    Call MD for:  temperature >100.4   Complete by:  As directed    Diet - low sodium heart healthy   Complete by:  As directed    Discharge wound care:   Complete by:  As directed    Ok to shower tomorrow. Remove bandaids tomorrow. Steristrips will likely peel off in 1-3 weeks. No bandage required   Driving Restrictions   Complete by:  As directed    No driving while on narcotics   Increase activity slowly   Complete by:  As directed    Lifting  restrictions   Complete by:  As directed    No lifting greater than 20 pounds for 3 weeks     Allergies as of 12/28/2016   No Known Allergies     Medication List    STOP taking these medications   Diclofenac Sodium 2 % Soln   rosuvastatin 20 MG tablet Commonly known as:  CRESTOR   traMADol 50 MG tablet Commonly known as:  ULTRAM     TAKE these medications   escitalopram 10 MG tablet Commonly known as:  LEXAPRO Take 10 mg by mouth daily as needed (depression). What changed:  Another medication with the same name was removed. Continue taking this medication, and follow the directions you see here.   gabapentin 100 MG capsule Commonly known as:  NEURONTIN TAKE 1 CAPSULE (100 MG TOTAL) BY MOUTH AT BEDTIME. What changed:  See the new instructions.   HYDROcodone-acetaminophen 5-325 MG tablet Commonly known as:  NORCO/VICODIN Take 1-2 tablets by mouth every 6 (six) hours as needed for moderate pain.   ibuprofen 800 MG tablet Commonly known as:  ADVIL,MOTRIN Take 1 tablet (800 mg total) by mouth every 8 (eight) hours as needed.   ondansetron 4 MG disintegrating tablet Commonly known as:  ZOFRAN-ODT Take 1 tablet (4 mg total) by mouth every 6 (six) hours as needed for nausea.   pantoprazole 40  MG tablet Commonly known as:  PROTONIX TAKE 1 TABLET (40 MG TOTAL) BY MOUTH DAILY. What changed:    when to take this  reasons to take this   triamcinolone cream 0.1 % Commonly known as:  KENALOG Apply 1 application topically 2 (two) times daily. What changed:  when to take this            Discharge Care Instructions  (From admission, onward)        Start     Ordered   12/27/16 0000  Discharge wound care:    Comments:  Ok to shower tomorrow. Remove bandaids tomorrow. Steristrips will likely peel off in 1-3 weeks. No bandage required   12/27/16 [id number]     Follow-up Information    Kostantinos Tallman, De BlanchLuke Aaron, MD In 3 weeks.   Specialty:  General Surgery Contact  information: [address] [place] [address] [phone]            The results of significant diagnostics from this hospitalization (including imaging, microbiology, ancillary and laboratory) are listed below for reference.    Significant Diagnostic Studies: No results found.  Labs: Basic Metabolic Panel: Recent Labs  Lab 12/24/16 1046  NA 136  K 4.2  CL 104  CO2 26  GLUCOSE 96  BUN 16  CREATININE 1.04  CALCIUM 9.2   Liver Function Tests: Recent Labs  Lab 12/24/16 1046  AST 25  ALT 18  ALKPHOS 65  BILITOT 1.0  PROT 7.5  ALBUMIN 4.2    CBC: Recent Labs  Lab 12/24/16 1046  WBC 7.9  HGB 15.2  HCT 41.5  MCV 81.4  PLT 260    CBG: No results for input(s): GLUCAP in the last 168 hours.  Active Problems:   Chronic cholecystitis   Time coordinating discharge: 15min

## 2016-12-28 NOTE — Care Management Note (Signed)
Case Management Note  Patient Details  Name: Mark GriffonDavid L Adams MRN: [id number] Date of Birth: 1970/01/27  Subjective/Objective:                  Lap chole  Action/Plan: Will follow for dc needs  Expected Discharge Date:  12/28/16               Expected Discharge Plan:  Home/Self Care  In-House Referral:     Discharge planning Services  CM Consult  Post Acute Care Choice:    Choice offered to:     DME Arranged:    DME Agency:     HH Arranged:    HH Agency:     Status of Service:  In process, will continue to follow  If discussed at Long Length of Stay Meetings, dates discussed:    Additional Comments:  [name], [name], RN 12/28/2016, 9:24 AM

## 2017-09-21 ENCOUNTER — Other Ambulatory Visit: Payer: Self-pay | Admitting: Internal Medicine

## 2018-06-24 ENCOUNTER — Ambulatory Visit (INDEPENDENT_AMBULATORY_CARE_PROVIDER_SITE_OTHER): Payer: Self-pay | Admitting: Internal Medicine

## 2018-06-24 ENCOUNTER — Encounter: Payer: Self-pay | Admitting: Internal Medicine

## 2018-06-24 DIAGNOSIS — E611 Iron deficiency: Secondary | ICD-10-CM

## 2018-06-24 DIAGNOSIS — F329 Major depressive disorder, single episode, unspecified: Secondary | ICD-10-CM

## 2018-06-24 DIAGNOSIS — Z114 Encounter for screening for human immunodeficiency virus [HIV]: Secondary | ICD-10-CM

## 2018-06-24 DIAGNOSIS — E559 Vitamin D deficiency, unspecified: Secondary | ICD-10-CM

## 2018-06-24 DIAGNOSIS — K219 Gastro-esophageal reflux disease without esophagitis: Secondary | ICD-10-CM | POA: Insufficient documentation

## 2018-06-24 DIAGNOSIS — F32A Depression, unspecified: Secondary | ICD-10-CM

## 2018-06-24 DIAGNOSIS — N320 Bladder-neck obstruction: Secondary | ICD-10-CM

## 2018-06-24 DIAGNOSIS — M519 Unspecified thoracic, thoracolumbar and lumbosacral intervertebral disc disorder: Secondary | ICD-10-CM

## 2018-06-24 DIAGNOSIS — E785 Hyperlipidemia, unspecified: Secondary | ICD-10-CM

## 2018-06-24 DIAGNOSIS — R739 Hyperglycemia, unspecified: Secondary | ICD-10-CM

## 2018-06-24 DIAGNOSIS — G5602 Carpal tunnel syndrome, left upper limb: Secondary | ICD-10-CM | POA: Insufficient documentation

## 2018-06-24 DIAGNOSIS — E538 Deficiency of other specified B group vitamins: Secondary | ICD-10-CM

## 2018-06-24 HISTORY — DX: Hyperlipidemia, unspecified: E78.5

## 2018-06-24 MED ORDER — PANTOPRAZOLE SODIUM 40 MG PO TBEC: 40.0000 mg | DELAYED_RELEASE_TABLET | Freq: Every day | ORAL | 3 refills | Status: DC

## 2018-06-24 MED ORDER — IBUPROFEN 800 MG PO TABS: 800.0000 mg | ORAL_TABLET | Freq: Two times a day (BID) | ORAL | 2 refills | Status: DC | PRN

## 2018-06-24 MED ORDER — ESCITALOPRAM OXALATE 10 MG PO TABS: 10.0000 mg | ORAL_TABLET | Freq: Every day | ORAL | 3 refills | Status: DC | PRN

## 2018-06-24 MED ORDER — GABAPENTIN 100 MG PO CAPS: ORAL_CAPSULE | ORAL | 3 refills | Status: DC

## 2018-06-24 NOTE — Patient Instructions (Signed)
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
## 2018-06-25 ENCOUNTER — Encounter: Payer: Self-pay | Admitting: Internal Medicine

## 2018-06-25 NOTE — Assessment & Plan Note (Signed)
Mild, for left wrist splint at night  to f/u any worsening symptoms or concerns

## 2018-06-25 NOTE — Assessment & Plan Note (Signed)
stable overall by history and exam, recent data reviewed with pt, and pt to continue medical treatment as before,  to f/u any worsening symptoms or concerns  

## 2018-06-25 NOTE — Assessment & Plan Note (Signed)
stable overall by history and exam, recent data reviewed with pt, and pt to continue medical treatment as before,  to f/u any worsening symptoms or concerns, for lipids with labs 

## 2018-07-14 ENCOUNTER — Telehealth: Payer: Self-pay | Admitting: Internal Medicine

## 2018-07-14 MED ORDER — PANTOPRAZOLE SODIUM 40 MG PO TBEC: 40.0000 mg | DELAYED_RELEASE_TABLET | Freq: Every day | ORAL | 3 refills | Status: DC

## 2018-07-14 NOTE — Telephone Encounter (Signed)
pantoprazole (PROTONIX) 40 MG tablet was sent to [facility] but they do not have insurance and can get it cheaper at [facility].  They would like it resent to  [place], [place]. [phone] (Phone) [phone] (Fax)

## 2018-07-17 ENCOUNTER — Other Ambulatory Visit: Payer: Self-pay | Admitting: Internal Medicine

## 2018-07-17 MED ORDER — PANTOPRAZOLE SODIUM 40 MG PO TBEC: 40.0000 mg | DELAYED_RELEASE_TABLET | Freq: Every day | ORAL | 3 refills | Status: DC

## 2018-07-17 NOTE — Addendum Note (Signed)
Addended by: Juliet Rude on: 07/17/2018 01:08 PM   Modules accepted: Orders

## 2018-07-17 NOTE — Telephone Encounter (Signed)
Patient's wife calling to see if this medication was sent to the [place], [place]. Advised that it had not been sent yet. Offered her the option of her calling the pharmacy to get it transferred, that sometimes that is a quicker option, and she said she would like the office to send it, but she would also call the pharmacy. Please advise.

## 2018-07-18 ENCOUNTER — Other Ambulatory Visit: Payer: Self-pay | Admitting: Internal Medicine

## 2019-02-25 ENCOUNTER — Telehealth: Payer: Self-pay

## 2019-02-25 MED ORDER — GABAPENTIN 100 MG PO CAPS: ORAL_CAPSULE | ORAL | 1 refills | Status: DC

## 2019-02-25 NOTE — Telephone Encounter (Signed)
Done erx 

## 2019-02-25 NOTE — Telephone Encounter (Signed)
Patient is requesting to take Gabapentin BID not qd.   Please advise

## 2019-02-25 NOTE — Telephone Encounter (Signed)
Notified pt MD sent rx to [facility] 

## 2019-03-10 ENCOUNTER — Other Ambulatory Visit: Payer: Self-pay

## 2019-03-10 ENCOUNTER — Encounter: Payer: Self-pay | Admitting: Internal Medicine

## 2019-03-10 ENCOUNTER — Ambulatory Visit (INDEPENDENT_AMBULATORY_CARE_PROVIDER_SITE_OTHER): Payer: Medicaid Other | Admitting: Internal Medicine

## 2019-03-10 VITALS — BP 132/88 | HR 86 | Temp 98.3°F | Ht 65.0 in | Wt 195.5 lb

## 2019-03-10 DIAGNOSIS — M542 Cervicalgia: Secondary | ICD-10-CM

## 2019-03-10 DIAGNOSIS — E785 Hyperlipidemia, unspecified: Secondary | ICD-10-CM | POA: Diagnosis not present

## 2019-03-10 DIAGNOSIS — K219 Gastro-esophageal reflux disease without esophagitis: Secondary | ICD-10-CM | POA: Diagnosis not present

## 2019-03-10 DIAGNOSIS — F32A Depression, unspecified: Secondary | ICD-10-CM

## 2019-03-10 DIAGNOSIS — F329 Major depressive disorder, single episode, unspecified: Secondary | ICD-10-CM

## 2019-03-10 DIAGNOSIS — L309 Dermatitis, unspecified: Secondary | ICD-10-CM

## 2019-03-10 MED ORDER — CYCLOBENZAPRINE HCL 5 MG PO TABS: 5.0000 mg | ORAL_TABLET | Freq: Three times a day (TID) | ORAL | 1 refills | Status: DC | PRN

## 2019-03-10 MED ORDER — TRAMADOL HCL 50 MG PO TABS: 50.0000 mg | ORAL_TABLET | Freq: Four times a day (QID) | ORAL | 0 refills | Status: DC | PRN

## 2019-03-10 NOTE — Patient Instructions (Signed)
Please take all new medication as prescribed- the pain medication, and muscle relaxer as needed  Please continue all other medications as before, and refills have been done if requested.  Please have the pharmacy call with any other refills you may need.  Please continue your efforts at being more active, low cholesterol diet, and weight control.  Please keep your appointments with your specialists as you may have planned
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
## 2019-03-15 ENCOUNTER — Encounter: Payer: Self-pay | Admitting: Internal Medicine

## 2019-03-15 DIAGNOSIS — M542 Cervicalgia: Secondary | ICD-10-CM | POA: Insufficient documentation

## 2019-03-15 NOTE — Assessment & Plan Note (Signed)
Mild to mod, for topical steroid,,  to f/u any worsening symptoms or concerns

## 2019-03-15 NOTE — Assessment & Plan Note (Signed)
stable overall by history and exam, recent data reviewed with pt, and pt to continue medical treatment as before,  to f/u any worsening symptoms or concerns  

## 2019-03-15 NOTE — Assessment & Plan Note (Addendum)
And upper back pain , for pain control and muscle relaxer prn,  to f/u any worsening symptoms or concerns  I spent 32 minutes in preparing to see the patient by review of recent labs, imaging and procedures, obtaining and reviewing separately obtained history, communicating with the patient and family or caregiver, ordering medications, tests or procedures, and documenting clinical information in the EHR including the differential Dx, treatment, and any further evaluation and other management of neck pain, HLD, GERD, rash, depression

## 2019-03-24 ENCOUNTER — Telehealth: Payer: Self-pay

## 2019-03-24 MED ORDER — TRIAMCINOLONE ACETONIDE 0.1 % EX CREA: 1.0000 "application " | TOPICAL_CREAM | Freq: Two times a day (BID) | CUTANEOUS | 1 refills | Status: DC

## 2019-03-24 NOTE — Telephone Encounter (Signed)
Very sorry, this was somehow overlooked at the visit, I will send rx for steroid cream

## 2019-03-24 NOTE — Telephone Encounter (Signed)
New message    The patient calling stating him and Dr. Jonny Ruiz discuss eczema cream for his leg.    [id number] [place], [place] - [address]

## 2019-03-25 NOTE — Telephone Encounter (Signed)
Spoke with patient today. 

## 2019-05-05 ENCOUNTER — Other Ambulatory Visit: Payer: Self-pay | Admitting: Internal Medicine

## 2019-05-08 ENCOUNTER — Ambulatory Visit: Payer: Medicaid Other | Attending: Internal Medicine

## 2019-05-08 DIAGNOSIS — Z23 Encounter for immunization: Secondary | ICD-10-CM

## 2019-05-08 NOTE — Progress Notes (Signed)
   Covid-19 Vaccination Clinic  Name:  Mark Adams    MRN: [id number] DOB: Mar 17, 1970  05/08/2019  Mr. Mark Adams was observed post Covid-19 immunization for 15 minutes without incident. He was provided with Vaccine Information Sheet and instruction to access the V-Safe system.   Mr. Mark Adams was instructed to call 911 with any severe reactions post vaccine: Marland Kitchen Difficulty breathing  . Swelling of face and throat  . A fast heartbeat  . A bad rash all over body  . Dizziness and weakness   Immunizations Administered    Name Date Dose VIS Date Route   Pfizer COVID-19 Vaccine 05/08/2019  4:52 PM 0.3 mL 03/04/2018 Intramuscular   Manufacturer: [employer], [employer]   Lot: [id number]   NDC: [address]-1

## 2019-06-01 ENCOUNTER — Ambulatory Visit: Payer: Medicaid Other | Attending: Internal Medicine

## 2019-06-01 DIAGNOSIS — Z23 Encounter for immunization: Secondary | ICD-10-CM

## 2019-06-01 NOTE — Progress Notes (Signed)
   Covid-19 Vaccination Clinic  Name:  CORDERRO KOLOSKI    MRN: [id number] DOB: 1970-12-16  06/01/2019  Mr. Risdon was observed post Covid-19 immunization for 15 minutes without incident. He was provided with Vaccine Information Sheet and instruction to access the V-Safe system.   Mr. Debski was instructed to call 911 with any severe reactions post vaccine: Marland Kitchen Difficulty breathing  . Swelling of face and throat  . A fast heartbeat  . A bad rash all over body  . Dizziness and weakness   Immunizations Administered    Name Date Dose VIS Date Route   Pfizer COVID-19 Vaccine 06/01/2019  4:40 PM 0.3 mL 03/04/2018 Intramuscular   Manufacturer: [employer], [employer]   Lot: [id number]   NDC: [address]-3

## 2019-06-30 ENCOUNTER — Encounter: Payer: Medicaid Other | Admitting: Internal Medicine

## 2019-07-17 ENCOUNTER — Other Ambulatory Visit: Payer: Self-pay | Admitting: Internal Medicine

## 2019-07-20 ENCOUNTER — Encounter: Payer: Medicaid Other | Admitting: Internal Medicine

## 2019-08-12 ENCOUNTER — Encounter: Payer: Medicaid Other | Admitting: Internal Medicine

## 2019-08-12 ENCOUNTER — Other Ambulatory Visit: Payer: Self-pay

## 2019-08-24 ENCOUNTER — Other Ambulatory Visit: Payer: Self-pay | Admitting: Internal Medicine

## 2019-10-21 ENCOUNTER — Ambulatory Visit (INDEPENDENT_AMBULATORY_CARE_PROVIDER_SITE_OTHER): Payer: BC Managed Care – PPO | Admitting: Internal Medicine

## 2019-10-21 ENCOUNTER — Encounter: Payer: Self-pay | Admitting: Internal Medicine

## 2019-10-21 ENCOUNTER — Other Ambulatory Visit: Payer: Self-pay

## 2019-10-21 VITALS — BP 138/90 | HR 63 | Temp 98.2°F | Ht 65.0 in | Wt 198.0 lb

## 2019-10-21 DIAGNOSIS — E538 Deficiency of other specified B group vitamins: Secondary | ICD-10-CM

## 2019-10-21 DIAGNOSIS — E559 Vitamin D deficiency, unspecified: Secondary | ICD-10-CM | POA: Diagnosis not present

## 2019-10-21 DIAGNOSIS — M5412 Radiculopathy, cervical region: Secondary | ICD-10-CM | POA: Diagnosis not present

## 2019-10-21 DIAGNOSIS — R739 Hyperglycemia, unspecified: Secondary | ICD-10-CM | POA: Diagnosis not present

## 2019-10-21 DIAGNOSIS — M79672 Pain in left foot: Secondary | ICD-10-CM

## 2019-10-21 DIAGNOSIS — E785 Hyperlipidemia, unspecified: Secondary | ICD-10-CM

## 2019-10-21 DIAGNOSIS — M542 Cervicalgia: Secondary | ICD-10-CM

## 2019-10-21 DIAGNOSIS — Z1159 Encounter for screening for other viral diseases: Secondary | ICD-10-CM | POA: Diagnosis not present

## 2019-10-21 DIAGNOSIS — L309 Dermatitis, unspecified: Secondary | ICD-10-CM

## 2019-10-21 DIAGNOSIS — Z0001 Encounter for general adult medical examination with abnormal findings: Secondary | ICD-10-CM

## 2019-10-21 DIAGNOSIS — Z0000 Encounter for general adult medical examination without abnormal findings: Secondary | ICD-10-CM | POA: Diagnosis not present

## 2019-10-21 LAB — CBC WITH DIFFERENTIAL/PLATELET
Basophils Absolute: 0.1 10*3/uL (ref 0.0–0.1)
Basophils Relative: 1 % (ref 0.0–3.0)
Eosinophils Absolute: 0.2 10*3/uL (ref 0.0–0.7)
Eosinophils Relative: 1.9 % (ref 0.0–5.0)
HCT: 43.2 % (ref 39.0–52.0)
Hemoglobin: 15 g/dL (ref 13.0–17.0)
Lymphocytes Relative: 30.6 % (ref 12.0–46.0)
Lymphs Abs: 2.5 10*3/uL (ref 0.7–4.0)
MCHC: 34.7 g/dL (ref 30.0–36.0)
MCV: 86.6 fl (ref 78.0–100.0)
Monocytes Absolute: 0.7 10*3/uL (ref 0.1–1.0)
Monocytes Relative: 8.4 % (ref 3.0–12.0)
Neutro Abs: 4.8 10*3/uL (ref 1.4–7.7)
Neutrophils Relative %: 58.1 % (ref 43.0–77.0)
Platelets: 244 10*3/uL (ref 150.0–400.0)
RBC: 4.99 Mil/uL (ref 4.22–5.81)
RDW: 14.5 % (ref 11.5–15.5)
WBC: 8.2 10*3/uL (ref 4.0–10.5)

## 2019-10-21 LAB — URINALYSIS, ROUTINE W REFLEX MICROSCOPIC
Bilirubin Urine: NEGATIVE
Hgb urine dipstick: NEGATIVE
Ketones, ur: NEGATIVE
Leukocytes,Ua: NEGATIVE
Nitrite: NEGATIVE
RBC / HPF: NONE SEEN (ref 0–?)
Specific Gravity, Urine: >=1.03 — AB (ref 1.000–1.030)
Total Protein, Urine: NEGATIVE
Urine Glucose: NEGATIVE
Urobilinogen, UA: 0.2 (ref 0.0–1.0)
pH: 6 (ref 5.0–8.0)

## 2019-10-21 LAB — HEMOGLOBIN A1C: Hgb A1c MFr Bld: 5.5 % (ref 4.6–6.5)

## 2019-10-21 LAB — LIPID PANEL
Cholesterol: 222 mg/dL — ABNORMAL HIGH (ref 0–200)
HDL: 33.2 mg/dL — ABNORMAL LOW (ref >39.00)
NonHDL: 188.55
Total CHOL/HDL Ratio: 7
Triglycerides: 236 mg/dL — ABNORMAL HIGH (ref 0.0–149.0)
VLDL: 47.2 mg/dL — ABNORMAL HIGH (ref 0.0–40.0)

## 2019-10-21 LAB — HEPATIC FUNCTION PANEL
ALT: 12 U/L (ref 0–53)
AST: 15 U/L (ref 0–37)
Albumin: 4.4 g/dL (ref 3.5–5.2)
Alkaline Phosphatase: 72 U/L (ref 39–117)
Bilirubin, Direct: 0.1 mg/dL (ref 0.0–0.3)
Total Bilirubin: 0.7 mg/dL (ref 0.2–1.2)
Total Protein: 7.2 g/dL (ref 6.0–8.3)

## 2019-10-21 LAB — VITAMIN B12: Vitamin B-12: 259 pg/mL (ref 211–911)

## 2019-10-21 LAB — LDL CHOLESTEROL, DIRECT: Direct LDL: 131 mg/dL

## 2019-10-21 LAB — VITAMIN D 25 HYDROXY (VIT D DEFICIENCY, FRACTURES): VITD: 19.21 ng/mL — ABNORMAL LOW (ref 30.00–100.00)

## 2019-10-21 LAB — TSH: TSH: 1.31 u[IU]/mL (ref 0.35–4.50)

## 2019-10-21 LAB — PSA: PSA: 0.77 ng/mL (ref 0.10–4.00)

## 2019-10-21 MED ORDER — PANTOPRAZOLE SODIUM 40 MG PO TBEC
40.0000 mg | DELAYED_RELEASE_TABLET | Freq: Every day | ORAL | 3 refills | Status: DC
Start: 2019-10-21 — End: 2020-12-19

## 2019-10-21 MED ORDER — TRAMADOL HCL 50 MG PO TABS
50.0000 mg | ORAL_TABLET | Freq: Four times a day (QID) | ORAL | 2 refills | Status: DC | PRN
Start: 2019-10-21 — End: 2021-05-09

## 2019-10-21 MED ORDER — FLUOCINONIDE 0.05 % EX CREA: 1.0000 "application " | TOPICAL_CREAM | Freq: Two times a day (BID) | CUTANEOUS | 2 refills | Status: DC

## 2019-10-21 MED ORDER — CYCLOBENZAPRINE HCL 5 MG PO TABS
ORAL_TABLET | ORAL | 1 refills | Status: DC
Start: 2019-10-21 — End: 2021-05-09

## 2019-10-21 MED ORDER — ESCITALOPRAM OXALATE 10 MG PO TABS
10.0000 mg | ORAL_TABLET | Freq: Every day | ORAL | 3 refills | Status: DC | PRN
Start: 2019-10-21 — End: 2020-04-13

## 2019-10-21 MED ORDER — GABAPENTIN 100 MG PO CAPS
ORAL_CAPSULE | ORAL | 1 refills | Status: DC
Start: 2019-10-21 — End: 2020-03-10

## 2019-10-21 MED ORDER — MELOXICAM 15 MG PO TABS: 15.0000 mg | ORAL_TABLET | Freq: Every day | ORAL | 2 refills | Status: DC | PRN
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
## 2019-10-21 NOTE — Patient Instructions (Signed)
Please take all new medication as prescribed - the meloxicam for pain to the foot, and the stonger steroid cream  Please continue all other medications as before, and all refills have been done as requested.  Please have the pharmacy call with any other refills you may need.  Please continue your efforts at being more active, low cholesterol diet, and weight control.  You are otherwise up to date with prevention measures today.  Please keep your appointments with your specialists as you may have planned  You will be contacted regarding the referral for: MRI and Neurosurgury referral  Please call for appt with Sports Medicine if the pain to the left foot is not improved with 1-2 weeks  Please go to the LAB at the blood drawing area for the tests to be done  You will be contacted by phone if any changes need to be made immediately.  Otherwise, you will receive a letter about your results with an explanation, but please check with MyChart first.  Please remember to sign up for MyChart if you have not done so, as this will be important to you in the future with finding out test results, communicating by private email, and scheduling acute appointments online when needed.  Please make an Appointment to return for your 6 months

## 2019-10-22 ENCOUNTER — Other Ambulatory Visit: Payer: Self-pay | Admitting: Internal Medicine

## 2019-10-22 ENCOUNTER — Encounter: Payer: Self-pay | Admitting: Internal Medicine

## 2019-10-22 LAB — HEPATITIS C ANTIBODY
Hepatitis C Ab: NONREACTIVE
SIGNAL TO CUT-OFF: 0.01 (ref <1.00)

## 2019-10-22 MED ORDER — VITAMIN D (ERGOCALCIFEROL) 1.25 MG (50000 UNIT) PO CAPS: 50000.0000 [IU] | ORAL_CAPSULE | ORAL | 0 refills | Status: AC

## 2019-10-26 ENCOUNTER — Encounter: Payer: Self-pay | Admitting: Internal Medicine

## 2019-10-26 NOTE — Assessment & Plan Note (Signed)
For topical steroid crm prn,  to f/u any worsening symptoms or concerns

## 2019-10-26 NOTE — Assessment & Plan Note (Addendum)
With radicular symptoms and LUE weakness, for MRI c spine, and NS referral  I spent 31 minutes in addition to time for CPX wellness examination in preparing to see the patient by review of recent labs, imaging and procedures, obtaining and reviewing separately obtained history, communicating with the patient and family or caregiver, ordering medications, tests or procedures, and documenting clinical information in the EHR including the differential Dx, treatment, and any further evaluation and other management of neck pain, left foot pain, hld, eczema

## 2019-10-26 NOTE — Assessment & Plan Note (Signed)
For f/u lipids, low chol diet,  to f/u any worsening symptoms or concerns

## 2019-10-26 NOTE — Assessment & Plan Note (Signed)
Exam benign, possible tendonitis, for nsaid prn, and consider sport med f/u though declines for now

## 2019-10-26 NOTE — Assessment & Plan Note (Signed)

## 2019-11-03 ENCOUNTER — Encounter: Payer: Self-pay | Admitting: Family Medicine

## 2019-11-03 ENCOUNTER — Other Ambulatory Visit: Payer: Self-pay

## 2019-11-03 ENCOUNTER — Ambulatory Visit (INDEPENDENT_AMBULATORY_CARE_PROVIDER_SITE_OTHER): Payer: BC Managed Care – PPO | Admitting: Family Medicine

## 2019-11-03 ENCOUNTER — Ambulatory Visit: Payer: Self-pay

## 2019-11-03 ENCOUNTER — Ambulatory Visit (INDEPENDENT_AMBULATORY_CARE_PROVIDER_SITE_OTHER): Payer: BC Managed Care – PPO

## 2019-11-03 VITALS — BP 142/92 | HR 78 | Ht 65.0 in | Wt 199.0 lb

## 2019-11-03 DIAGNOSIS — M7742 Metatarsalgia, left foot: Secondary | ICD-10-CM

## 2019-11-03 DIAGNOSIS — M79672 Pain in left foot: Secondary | ICD-10-CM | POA: Diagnosis not present

## 2019-11-03 NOTE — Patient Instructions (Addendum)
Thank you for coming in today.  Try the metatarsal pads in the shoes.  Ok to Get over the counter insoles for metatarsalgia which have the pad built into it.   Please get an Xray today before you leave  Please use voltaren gel up to 4x daily for pain as needed.   Recheck in about 1 month.  Let me know sooner if not doing well.
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
## 2019-11-06 NOTE — Progress Notes (Signed)
X-ray left foot looks normal to radiology

## 2019-11-06 NOTE — Progress Notes (Signed)
Spoke c pt and confirmed he understood his XR results  Mark Adams

## 2019-11-10 ENCOUNTER — Ambulatory Visit
Admission: RE | Admit: 2019-11-10 | Discharge: 2019-11-10 | Disposition: A | Discharge status: Home / Self Care | Payer: BC Managed Care – PPO | Source: Ambulatory Visit | Attending: Internal Medicine | Admitting: Internal Medicine

## 2019-11-10 ENCOUNTER — Encounter: Payer: Self-pay | Admitting: Internal Medicine

## 2019-11-10 DIAGNOSIS — M4312 Spondylolisthesis, cervical region: Secondary | ICD-10-CM | POA: Diagnosis not present

## 2019-11-10 DIAGNOSIS — G9589 Other specified diseases of spinal cord: Secondary | ICD-10-CM | POA: Diagnosis not present

## 2019-11-10 DIAGNOSIS — M542 Cervicalgia: Secondary | ICD-10-CM

## 2019-11-10 DIAGNOSIS — M4802 Spinal stenosis, cervical region: Secondary | ICD-10-CM | POA: Diagnosis not present

## 2019-11-10 DIAGNOSIS — Z0001 Encounter for general adult medical examination with abnormal findings: Secondary | ICD-10-CM

## 2019-11-11 DIAGNOSIS — G5600 Carpal tunnel syndrome, unspecified upper limb: Secondary | ICD-10-CM | POA: Diagnosis not present

## 2019-11-30 DIAGNOSIS — M4802 Spinal stenosis, cervical region: Secondary | ICD-10-CM | POA: Diagnosis not present

## 2019-11-30 DIAGNOSIS — R200 Anesthesia of skin: Secondary | ICD-10-CM | POA: Diagnosis not present

## 2019-12-07 NOTE — Progress Notes (Signed)
   I, Christoper Fabian, LAT, ATC, am serving as scribe for Dr. Clementeen Graham.  Mark Adams is a 49 y.o. male who presents to [facility] today for f/u of L foot / medial arch pain.  He was last seen by Dr. Denyse Amass on 11/03/19 and was provided w/ MT pads and advised to use Voltaren gel.  Since his last visit, pt reports that his L foot is doing better and notes less pain.  He has been using his MT pads but did not get any Voltaren gel.  He estimates moderate to significant improvement.  Diagnostic imaging: L foot XR- 11/03/19  Pertinent review of systems: No fevers or chills  Relevant historical information: History of spinal cord injury   Exam:  BP 120/86 (BP Location: Right Arm, Patient Position: Sitting, Cuff Size: Large)   Pulse 82   Ht 5\' 5"  (1.651 m)   Wt 196 lb (88.9 kg)   SpO2 97%   BMI 32.62 kg/m  General: Well Developed, well nourished, and in no acute distress.   MSK: Left foot normal-appearing normal motion mildly tender palpation distal plantar foot near the third MCP and midshaft third metatarsal.   Assessment and Plan: 49 y.o. male with left foot pain multifactorial.  Fortunately he is improving significantly.  Majority of the pain due to metatarsalgia.  Does have some mid arch pain that could be arch strain or even peripheral neuropathy or other neurologic paresthesia.  Plan to continue metatarsal pads and add Voltaren gel.  If not improving consider MRI or even nerve conduction study.  Check back as needed.    Discussed warning signs or symptoms. Please see discharge instructions. Patient expresses understanding.   The above documentation has been reviewed and is accurate and complete 54, M.D.

## 2019-12-08 ENCOUNTER — Other Ambulatory Visit: Payer: Self-pay

## 2019-12-08 ENCOUNTER — Ambulatory Visit (INDEPENDENT_AMBULATORY_CARE_PROVIDER_SITE_OTHER): Payer: BC Managed Care – PPO | Admitting: Family Medicine

## 2019-12-08 ENCOUNTER — Encounter: Payer: Self-pay | Admitting: Family Medicine

## 2019-12-08 VITALS — BP 120/86 | HR 82 | Ht 65.0 in | Wt 196.0 lb

## 2019-12-08 DIAGNOSIS — M7742 Metatarsalgia, left foot: Secondary | ICD-10-CM | POA: Diagnosis not present

## 2019-12-08 DIAGNOSIS — M79672 Pain in left foot: Secondary | ICD-10-CM

## 2019-12-08 NOTE — Patient Instructions (Addendum)
Thank you for coming in today.  Please use voltaren gel up to 4x daily for pain as needed.   Continue the metatarsal pads.   If not improving let me know   Next step is probably MRI.   Keep me updated.

## 2019-12-26 ENCOUNTER — Other Ambulatory Visit: Payer: Self-pay | Admitting: Internal Medicine

## 2019-12-26 NOTE — Telephone Encounter (Signed)
Ok for cream only  Please change to OTC Vitamin D3 at 2000 units per day, indefinitely.

## 2020-01-04 ENCOUNTER — Other Ambulatory Visit: Payer: Self-pay | Admitting: Internal Medicine

## 2020-01-04 NOTE — Telephone Encounter (Signed)
Cream is refilled  But Please let pt know to take OTC Vitamin D3 at 2000 units per day, indefinitely.

## 2020-01-21 ENCOUNTER — Other Ambulatory Visit: Payer: Self-pay | Admitting: Internal Medicine

## 2020-01-21 NOTE — Telephone Encounter (Signed)
Please change to OTC Vitamin D3 at 2000 units per day, indefinitely.  

## 2020-01-29 ENCOUNTER — Other Ambulatory Visit: Payer: Self-pay | Admitting: Internal Medicine

## 2020-01-31 NOTE — Telephone Encounter (Signed)
Please change to OTC Vitamin D3 at 2000 units per day, indefinitely.  

## 2020-02-05 ENCOUNTER — Telehealth: Payer: Self-pay

## 2020-02-05 DIAGNOSIS — L309 Dermatitis, unspecified: Secondary | ICD-10-CM

## 2020-02-05 NOTE — Telephone Encounter (Signed)
[id number]  Started via cover my meds.   This will likely not be approved. Insurance probably prefers the ointment which is available and a lot cheaper.

## 2020-02-08 NOTE — Telephone Encounter (Signed)
Ok for change.

## 2020-02-08 NOTE — Telephone Encounter (Signed)
Okay to change rx to ointment?

## 2020-02-09 MED ORDER — FLUOCINONIDE 0.05 % EX OINT: 1.0000 "application " | TOPICAL_OINTMENT | Freq: Two times a day (BID) | CUTANEOUS | 0 refills | Status: DC

## 2020-02-09 NOTE — Telephone Encounter (Signed)
Spoke to patient and informed that the rx for ointment has been sent to the pharmacy and explained the there was a change due to the cream not being covered by his insurance and also why we were unable to get it covered. (no previous therapies tried and failed to gain approval on the PA for the cream).   Pt stated understanding and agreed. Erx has been sent to pharmacy with a note to cancel the rx for the cream. Med list also updated with note on the cream that it was not covered by insurance.

## 2020-03-10 ENCOUNTER — Other Ambulatory Visit: Payer: Self-pay | Admitting: Internal Medicine

## 2020-03-24 ENCOUNTER — Encounter: Payer: Self-pay | Admitting: Internal Medicine

## 2020-03-24 ENCOUNTER — Other Ambulatory Visit: Payer: Self-pay

## 2020-03-24 ENCOUNTER — Ambulatory Visit: Payer: Medicaid Other | Admitting: Internal Medicine

## 2020-03-24 VITALS — BP 126/62 | HR 76 | Temp 98.4°F | Resp 18 | Ht 65.0 in | Wt 196.6 lb

## 2020-03-24 DIAGNOSIS — Z1211 Encounter for screening for malignant neoplasm of colon: Secondary | ICD-10-CM | POA: Diagnosis not present

## 2020-03-24 DIAGNOSIS — R1011 Right upper quadrant pain: Secondary | ICD-10-CM | POA: Diagnosis not present

## 2020-03-24 LAB — COMPREHENSIVE METABOLIC PANEL
ALT: 15 U/L (ref 0–53)
AST: 19 U/L (ref 0–37)
Albumin: 4.2 g/dL (ref 3.5–5.2)
Alkaline Phosphatase: 65 U/L (ref 39–117)
BUN: 12 mg/dL (ref 6–23)
CO2: 28 mEq/L (ref 19–32)
Calcium: 9.1 mg/dL (ref 8.4–10.5)
Chloride: 103 mEq/L (ref 96–112)
Creatinine, Ser: 1.17 mg/dL (ref 0.40–1.50)
GFR: 72.82 mL/min (ref >60.00)
Glucose, Bld: 86 mg/dL (ref 70–99)
Potassium: 4 mEq/L (ref 3.5–5.1)
Sodium: 139 mEq/L (ref 135–145)
Total Bilirubin: 1 mg/dL (ref 0.2–1.2)
Total Protein: 7.6 g/dL (ref 6.0–8.3)

## 2020-03-24 LAB — CBC
HCT: 43.1 % (ref 39.0–52.0)
Hemoglobin: 15 g/dL (ref 13.0–17.0)
MCHC: 34.9 g/dL (ref 30.0–36.0)
MCV: 85 fl (ref 78.0–100.0)
Platelets: 232 10*3/uL (ref 150.0–400.0)
RBC: 5.07 Mil/uL (ref 4.22–5.81)
RDW: 14.2 % (ref 11.5–15.5)
WBC: 8.7 10*3/uL (ref 4.0–10.5)

## 2020-03-24 LAB — LIPASE: Lipase: 22 U/L (ref 11.0–59.0)

## 2020-03-24 NOTE — Patient Instructions (Signed)
We will check the labs today and call you back about the results.   We will get you in for colon cancer screening.  If we do not find anything we can consider an ultrasound of the stomach to check the organs.

## 2020-03-24 NOTE — Progress Notes (Signed)
   Subjective:   Patient ID: Mark Adams, male    DOB: [birth year], 50 y.o.   MRN: [id number]  HPI The patient is a 50 YO man coming in for stomach pain. Is on the right side of the stomach kind of toward the middle. He had prior gall bladder removal. Denies fevers or chills. Denies food changing the pain. He has never had colonoscopy and is willing if needed. Denies weight change in the recent past. Denies diet changes. Denies diarrhea. Did have constipation at the onset and thought that was the cause but then took otc and is now going regularly and this is not improved. Denies blood in stool or dark stool.   Review of Systems  Constitutional: Negative.   HENT: Negative.   Eyes: Negative.   Respiratory: Negative for cough, chest tightness and shortness of breath.   Cardiovascular: Negative for chest pain, palpitations and leg swelling.  Gastrointestinal: Positive for abdominal distention and abdominal pain. Negative for anal bleeding, blood in stool, constipation, diarrhea, nausea and vomiting.  Musculoskeletal: Negative.   Skin: Negative.   Neurological: Negative.   Psychiatric/Behavioral: Negative.     Objective:  Physical Exam Constitutional:      Appearance: He is well-developed.  HENT:     Head: Normocephalic and atraumatic.  Cardiovascular:     Rate and Rhythm: Normal rate and regular rhythm.  Pulmonary:     Effort: Pulmonary effort is normal. No respiratory distress.     Breath sounds: Normal breath sounds. No wheezing or rales.  Abdominal:     General: Bowel sounds are normal. There is no distension.     Palpations: Abdomen is soft.     Tenderness: There is no abdominal tenderness. There is no rebound.  Musculoskeletal:        General: Tenderness present.     Cervical back: Normal range of motion.     Comments: RUQ minimally tender  Skin:    General: Skin is warm and dry.  Neurological:     Mental Status: He is alert and oriented to person, place, and time.      Coordination: Coordination normal.     Vitals:   03/24/20 1000  BP: 126/62  Pulse: 76  Resp: 18  Temp: 98.4 F (36.9 C)  TempSrc: Oral  SpO2: 97%  Weight: 196 lb 9.6 oz (89.2 kg)  Height: 5\' 5"  (1.651 m)    This visit occurred during the SARS-CoV-2 public health emergency.  Safety protocols were in place, including screening questions prior to the visit, additional usage of staff PPE, and extensive cleaning of exam room while observing appropriate contact time as indicated for disinfecting solutions.   Assessment & Plan:

## 2020-03-25 ENCOUNTER — Encounter: Payer: Self-pay | Admitting: Internal Medicine

## 2020-03-25 NOTE — Assessment & Plan Note (Signed)
Checking CBC, CMP, lipase. Referral to GI done for colon cancer screening as patient is willing. If no abnormal labs can do [place] to evaluate. Prior cholecystectomy.

## 2020-04-13 ENCOUNTER — Other Ambulatory Visit: Payer: Self-pay | Admitting: Internal Medicine

## 2020-04-15 ENCOUNTER — Other Ambulatory Visit: Payer: Self-pay | Admitting: Internal Medicine

## 2020-04-20 ENCOUNTER — Encounter: Payer: Self-pay | Admitting: Internal Medicine

## 2020-04-20 ENCOUNTER — Other Ambulatory Visit: Payer: Self-pay

## 2020-04-20 ENCOUNTER — Ambulatory Visit: Payer: Medicaid Other | Admitting: Internal Medicine

## 2020-04-20 ENCOUNTER — Other Ambulatory Visit: Payer: Self-pay | Admitting: Internal Medicine

## 2020-04-20 VITALS — BP 136/82 | HR 78 | Ht 65.0 in | Wt 198.0 lb

## 2020-04-20 DIAGNOSIS — R030 Elevated blood-pressure reading, without diagnosis of hypertension: Secondary | ICD-10-CM

## 2020-04-20 DIAGNOSIS — R739 Hyperglycemia, unspecified: Secondary | ICD-10-CM

## 2020-04-20 DIAGNOSIS — Z1211 Encounter for screening for malignant neoplasm of colon: Secondary | ICD-10-CM

## 2020-04-20 DIAGNOSIS — R1011 Right upper quadrant pain: Secondary | ICD-10-CM | POA: Diagnosis not present

## 2020-04-20 DIAGNOSIS — E559 Vitamin D deficiency, unspecified: Secondary | ICD-10-CM

## 2020-04-20 DIAGNOSIS — Z0001 Encounter for general adult medical examination with abnormal findings: Secondary | ICD-10-CM

## 2020-04-20 DIAGNOSIS — M62561 Muscle wasting and atrophy, not elsewhere classified, right lower leg: Secondary | ICD-10-CM | POA: Insufficient documentation

## 2020-04-20 DIAGNOSIS — E538 Deficiency of other specified B group vitamins: Secondary | ICD-10-CM

## 2020-04-20 DIAGNOSIS — Z0000 Encounter for general adult medical examination without abnormal findings: Secondary | ICD-10-CM | POA: Diagnosis not present

## 2020-04-20 DIAGNOSIS — E782 Mixed hyperlipidemia: Secondary | ICD-10-CM | POA: Diagnosis not present

## 2020-04-20 LAB — TSH: TSH: 1.25 u[IU]/mL (ref 0.35–4.50)

## 2020-04-20 LAB — HEPATIC FUNCTION PANEL
ALT: 13 U/L (ref 0–53)
AST: 17 U/L (ref 0–37)
Albumin: 4.2 g/dL (ref 3.5–5.2)
Alkaline Phosphatase: 68 U/L (ref 39–117)
Bilirubin, Direct: 0.1 mg/dL (ref 0.0–0.3)
Total Bilirubin: 0.8 mg/dL (ref 0.2–1.2)
Total Protein: 7.3 g/dL (ref 6.0–8.3)

## 2020-04-20 LAB — CBC WITH DIFFERENTIAL/PLATELET
Basophils Absolute: 0.1 10*3/uL (ref 0.0–0.1)
Basophils Relative: 0.9 % (ref 0.0–3.0)
Eosinophils Absolute: 0.3 10*3/uL (ref 0.0–0.7)
Eosinophils Relative: 3.2 % (ref 0.0–5.0)
HCT: 43.8 % (ref 39.0–52.0)
Hemoglobin: 15.2 g/dL (ref 13.0–17.0)
Lymphocytes Relative: 32.3 % (ref 12.0–46.0)
Lymphs Abs: 2.8 10*3/uL (ref 0.7–4.0)
MCHC: 34.7 g/dL (ref 30.0–36.0)
MCV: 85.2 fl (ref 78.0–100.0)
Monocytes Absolute: 0.7 10*3/uL (ref 0.1–1.0)
Monocytes Relative: 8.6 % (ref 3.0–12.0)
Neutro Abs: 4.7 10*3/uL (ref 1.4–7.7)
Neutrophils Relative %: 55 % (ref 43.0–77.0)
Platelets: 242 10*3/uL (ref 150.0–400.0)
RBC: 5.14 Mil/uL (ref 4.22–5.81)
RDW: 14.6 % (ref 11.5–15.5)
WBC: 8.6 10*3/uL (ref 4.0–10.5)

## 2020-04-20 LAB — URINALYSIS, ROUTINE W REFLEX MICROSCOPIC
Bilirubin Urine: NEGATIVE
Hgb urine dipstick: NEGATIVE
Ketones, ur: NEGATIVE
Leukocytes,Ua: NEGATIVE
Nitrite: NEGATIVE
RBC / HPF: NONE SEEN (ref 0–?)
Specific Gravity, Urine: 1.025 (ref 1.000–1.030)
Total Protein, Urine: NEGATIVE
Urine Glucose: NEGATIVE
Urobilinogen, UA: 0.2 (ref 0.0–1.0)
pH: 6 (ref 5.0–8.0)

## 2020-04-20 LAB — BASIC METABOLIC PANEL
BUN: 10 mg/dL (ref 6–23)
CO2: 30 mEq/L (ref 19–32)
Calcium: 9.4 mg/dL (ref 8.4–10.5)
Chloride: 103 mEq/L (ref 96–112)
Creatinine, Ser: 1.2 mg/dL (ref 0.40–1.50)
GFR: 70.61 mL/min (ref >60.00)
Glucose, Bld: 78 mg/dL (ref 70–99)
Potassium: 4.2 mEq/L (ref 3.5–5.1)
Sodium: 140 mEq/L (ref 135–145)

## 2020-04-20 LAB — LIPID PANEL
Cholesterol: 213 mg/dL — ABNORMAL HIGH (ref 0–200)
HDL: 34.1 mg/dL — ABNORMAL LOW (ref >39.00)
LDL Cholesterol: 144 mg/dL — ABNORMAL HIGH (ref 0–99)
NonHDL: 179.14
Total CHOL/HDL Ratio: 6
Triglycerides: 177 mg/dL — ABNORMAL HIGH (ref 0.0–149.0)
VLDL: 35.4 mg/dL (ref 0.0–40.0)

## 2020-04-20 LAB — PSA: PSA: 0.9 ng/mL (ref 0.10–4.00)

## 2020-04-20 LAB — VITAMIN B12: Vitamin B-12: 273 pg/mL (ref 211–911)

## 2020-04-20 LAB — VITAMIN D 25 HYDROXY (VIT D DEFICIENCY, FRACTURES): VITD: 32.4 ng/mL (ref 30.00–100.00)

## 2020-04-20 MED ORDER — ATORVASTATIN CALCIUM 20 MG PO TABS: 20.0000 mg | ORAL_TABLET | Freq: Every day | ORAL | 3 refills | Status: DC

## 2020-04-20 MED ORDER — THERA-D 2000 50 MCG (2000 UT) PO TABS: ORAL_TABLET | ORAL | 99 refills | Status: AC
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
## 2020-04-20 NOTE — Patient Instructions (Signed)
Please continue all other medications as before, and refills have been done if requested.  Please have the pharmacy call with any other refills you may need.  Please continue your efforts at being more active, low cholesterol diet, and weight control.  You are otherwise up to date with prevention measures today.  Please keep your appointments with your specialists as you may have planned  You will be contacted regarding the referral for: colonoscopy, and CT scan  Please take OTC Vitamin D3 at 2000 units per day, indefinitely.  Please go to the LAB at the blood drawing area for the tests to be done  You will be contacted by phone if any changes need to be made immediately.  Otherwise, you will receive a letter about your results with an explanation, but please check with MyChart first.  Please remember to sign up for MyChart if you have not done so, as this will be important to you in the future with finding out test results, communicating by private email, and scheduling acute appointments online when needed.  Please make an Appointment to return in 6 months, or sooner if needed

## 2020-04-23 ENCOUNTER — Encounter: Payer: Self-pay | Admitting: Internal Medicine

## 2020-04-23 DIAGNOSIS — R1011 Right upper quadrant pain: Secondary | ICD-10-CM | POA: Insufficient documentation

## 2020-04-23 DIAGNOSIS — E559 Vitamin D deficiency, unspecified: Secondary | ICD-10-CM | POA: Insufficient documentation

## 2020-04-23 NOTE — Assessment & Plan Note (Signed)
Last vitamin D Lab Results  Component Value Date   VD25OH 32.40 04/20/2020   Stable, cont oral replacement

## 2020-04-23 NOTE — Assessment & Plan Note (Signed)

## 2020-04-23 NOTE — Assessment & Plan Note (Signed)
Borderline elevated today, for low salt diet, wt control, excercise

## 2020-04-23 NOTE — Assessment & Plan Note (Addendum)
Etiology unclear, exam benign, for labs as ordered, for CT renal study, to f/u any worsening symptoms or concerns

## 2020-04-23 NOTE — Assessment & Plan Note (Signed)
Lab Results  Component Value Date   LDLCALC 144 (H) 04/20/2020   Uncontrolled, pt to restart current statin - lipitor

## 2020-05-02 ENCOUNTER — Other Ambulatory Visit: Payer: Self-pay | Admitting: Internal Medicine

## 2020-05-02 DIAGNOSIS — R1011 Right upper quadrant pain: Secondary | ICD-10-CM

## 2020-05-03 ENCOUNTER — Telehealth: Payer: Self-pay | Admitting: Internal Medicine

## 2020-05-03 MED ORDER — ROSUVASTATIN CALCIUM 20 MG PO TABS: 20.0000 mg | ORAL_TABLET | Freq: Every day | ORAL | 3 refills | Status: DC

## 2020-05-03 NOTE — Telephone Encounter (Signed)
Ok sure, this is changed to cestor 20 mg - done erx

## 2020-05-03 NOTE — Telephone Encounter (Signed)
Patient wants to know if there is something else he could take instead of atorvastatin (LIPITOR) 20 MG tablet   He said maybe a different brand, he could not remember the exact name of what else he could take, please advise

## 2020-05-03 NOTE — Telephone Encounter (Addendum)
Patient has been notified that Crestor has been sent to pharmacy.

## 2020-05-06 ENCOUNTER — Other Ambulatory Visit: Payer: Medicaid Other

## 2020-05-18 ENCOUNTER — Other Ambulatory Visit: Payer: Self-pay | Admitting: Internal Medicine

## 2020-05-19 ENCOUNTER — Ambulatory Visit
Admission: RE | Admit: 2020-05-19 | Discharge: 2020-05-19 | Disposition: A | Discharge status: Home / Self Care | Payer: Medicaid Other | Source: Ambulatory Visit | Attending: Internal Medicine | Admitting: Internal Medicine

## 2020-05-19 ENCOUNTER — Encounter: Payer: Self-pay | Admitting: Internal Medicine

## 2020-05-19 DIAGNOSIS — R1011 Right upper quadrant pain: Secondary | ICD-10-CM

## 2020-06-06 ENCOUNTER — Other Ambulatory Visit: Payer: Self-pay | Admitting: Internal Medicine

## 2020-07-20 ENCOUNTER — Encounter: Payer: Self-pay | Admitting: Gastroenterology

## 2020-09-21 ENCOUNTER — Other Ambulatory Visit: Payer: Self-pay | Admitting: Internal Medicine

## 2020-10-20 ENCOUNTER — Ambulatory Visit: Payer: Medicaid Other | Admitting: Internal Medicine

## 2020-10-20 ENCOUNTER — Encounter: Payer: Self-pay | Admitting: Internal Medicine

## 2020-10-20 ENCOUNTER — Other Ambulatory Visit: Payer: Self-pay

## 2020-10-20 VITALS — BP 148/92 | HR 80 | Temp 98.2°F | Ht 66.0 in | Wt 197.0 lb

## 2020-10-20 DIAGNOSIS — R030 Elevated blood-pressure reading, without diagnosis of hypertension: Secondary | ICD-10-CM

## 2020-10-20 DIAGNOSIS — E7800 Pure hypercholesterolemia, unspecified: Secondary | ICD-10-CM

## 2020-10-20 DIAGNOSIS — N419 Inflammatory disease of prostate, unspecified: Secondary | ICD-10-CM

## 2020-10-20 DIAGNOSIS — E538 Deficiency of other specified B group vitamins: Secondary | ICD-10-CM | POA: Diagnosis not present

## 2020-10-20 DIAGNOSIS — R350 Frequency of micturition: Secondary | ICD-10-CM

## 2020-10-20 DIAGNOSIS — E559 Vitamin D deficiency, unspecified: Secondary | ICD-10-CM

## 2020-10-20 LAB — LIPID PANEL
Cholesterol: 225 mg/dL — ABNORMAL HIGH (ref 0–200)
HDL: 33.7 mg/dL — ABNORMAL LOW
LDL Cholesterol: 152 mg/dL — ABNORMAL HIGH (ref 0–99)
NonHDL: 191.76
Total CHOL/HDL Ratio: 7
Triglycerides: 199 mg/dL — ABNORMAL HIGH (ref 0.0–149.0)
VLDL: 39.8 mg/dL (ref 0.0–40.0)

## 2020-10-20 LAB — CBC WITH DIFFERENTIAL/PLATELET
Basophils Absolute: 0.1 10*3/uL (ref 0.0–0.1)
Basophils Relative: 0.6 % (ref 0.0–3.0)
Eosinophils Absolute: 0.2 10*3/uL (ref 0.0–0.7)
Eosinophils Relative: 2.5 % (ref 0.0–5.0)
HCT: 43 % (ref 39.0–52.0)
Hemoglobin: 15.1 g/dL (ref 13.0–17.0)
Lymphocytes Relative: 28.8 % (ref 12.0–46.0)
Lymphs Abs: 2.4 10*3/uL (ref 0.7–4.0)
MCHC: 35.2 g/dL (ref 30.0–36.0)
MCV: 84.4 fl (ref 78.0–100.0)
Monocytes Absolute: 0.7 10*3/uL (ref 0.1–1.0)
Monocytes Relative: 8.2 % (ref 3.0–12.0)
Neutro Abs: 4.9 10*3/uL (ref 1.4–7.7)
Neutrophils Relative %: 59.9 % (ref 43.0–77.0)
Platelets: 247 10*3/uL (ref 150.0–400.0)
RBC: 5.1 Mil/uL (ref 4.22–5.81)
RDW: 14.6 % (ref 11.5–15.5)
WBC: 8.2 10*3/uL (ref 4.0–10.5)

## 2020-10-20 LAB — HEPATIC FUNCTION PANEL
ALT: 23 U/L (ref 0–53)
AST: 23 U/L (ref 0–37)
Albumin: 4.4 g/dL (ref 3.5–5.2)
Alkaline Phosphatase: 71 U/L (ref 39–117)
Bilirubin, Direct: 0.1 mg/dL (ref 0.0–0.3)
Total Bilirubin: 0.7 mg/dL (ref 0.2–1.2)
Total Protein: 7.5 g/dL (ref 6.0–8.3)

## 2020-10-20 LAB — URINALYSIS, ROUTINE W REFLEX MICROSCOPIC
Bilirubin Urine: NEGATIVE
Hgb urine dipstick: NEGATIVE
Ketones, ur: NEGATIVE
Leukocytes,Ua: NEGATIVE
Nitrite: NEGATIVE
Specific Gravity, Urine: 1.015 (ref 1.000–1.030)
Total Protein, Urine: NEGATIVE
Urine Glucose: NEGATIVE
Urobilinogen, UA: 0.2 (ref 0.0–1.0)
WBC, UA: NONE SEEN (ref 0–?)
pH: 7.5 (ref 5.0–8.0)

## 2020-10-20 MED ORDER — DOXYCYCLINE HYCLATE 100 MG PO TABS: 100.0000 mg | ORAL_TABLET | Freq: Two times a day (BID) | ORAL | 0 refills | Status: DC

## 2020-10-20 NOTE — Patient Instructions (Signed)
Ok to incresae the Vit D3 to 4000 units per day  Please take all new medication as recommended - B12 1000 mcg per day for 6 months  Please take all new medication as prescribed - the antibiotic for possible prostatitis  Please continue to monitor your BP at home, with the goal being to be less than 140/90 for the most part  Please continue all other medications as before, and refills have been done if requested.  Please have the pharmacy call with any other refills you may need.  Please continue your efforts at being more active, low cholesterol diet, and weight control.  You are otherwise up to date with prevention measures today.  Please keep your appointments with your specialists as you may have planned  Please go to the LAB at the blood drawing area for the tests to be done  You will be contacted by phone if any changes need to be made immediately.  Otherwise, you will receive a letter about your results with an explanation, but please check with MyChart first.  Please remember to sign up for MyChart if you have not done so, as this will be important to you in the future with finding out test results, communicating by private email, and scheduling acute appointments online when needed.  Please make an Appointment to return in 6 months, or sooner if needed
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
## 2020-10-20 NOTE — Assessment & Plan Note (Addendum)
Last vitamin D Lab Results  Component Value Date   VD25OH 32.40 04/20/2020   Low, to incresae oral replacement 4000 u per [employer]

## 2020-10-21 ENCOUNTER — Encounter: Payer: Self-pay | Admitting: Internal Medicine

## 2020-10-21 LAB — URINE CULTURE: Result:: NO GROWTH

## 2020-10-22 ENCOUNTER — Encounter: Payer: Self-pay | Admitting: Internal Medicine

## 2020-10-22 DIAGNOSIS — E538 Deficiency of other specified B group vitamins: Secondary | ICD-10-CM | POA: Insufficient documentation

## 2020-10-22 NOTE — Assessment & Plan Note (Signed)
BP Readings from Last 3 Encounters:  10/20/20 (!) 148/92  04/20/20 136/82  03/24/20 126/62   Uncontrolled, pt states BP at home < 140/90, pt to continue medical treatment - diet, wt control, low salt

## 2020-10-22 NOTE — Assessment & Plan Note (Signed)
Lab Results  Component Value Date   VITAMINB12 273 04/20/2020   Low, to start oral replacement - b12 1000 mcg qd

## 2020-10-22 NOTE — Assessment & Plan Note (Signed)
Lab Results  Component Value Date   LDLCALC 152 (H) 10/20/2020   Uncontrolled,, pt unable to tolerate crestor, declines further tx for now, to follow lower chol diet

## 2020-10-22 NOTE — Assessment & Plan Note (Signed)
Mild to mod, for antibx course,  to f/u any worsening symptoms or concerns 

## 2020-12-18 ENCOUNTER — Other Ambulatory Visit: Payer: Self-pay | Admitting: Internal Medicine

## 2020-12-18 NOTE — Telephone Encounter (Signed)
Please refill as per office routine med refill policy (all routine meds to be refilled for 3 mo or monthly (per pt preference) up to one year from last visit, then month to month grace period for 3 mo, then further med refills will have to be denied) ? ?

## 2021-01-02 ENCOUNTER — Other Ambulatory Visit: Payer: Self-pay | Admitting: Internal Medicine

## 2021-02-10 ENCOUNTER — Other Ambulatory Visit: Payer: Self-pay | Admitting: Internal Medicine

## 2021-05-09 ENCOUNTER — Encounter: Payer: Self-pay | Admitting: Internal Medicine

## 2021-05-09 ENCOUNTER — Ambulatory Visit: Payer: Medicaid Other | Admitting: Internal Medicine

## 2021-05-09 ENCOUNTER — Ambulatory Visit (INDEPENDENT_AMBULATORY_CARE_PROVIDER_SITE_OTHER): Payer: Medicaid Other

## 2021-05-09 VITALS — BP 136/82 | HR 74 | Temp 98.1°F | Ht 66.0 in | Wt 193.8 lb

## 2021-05-09 DIAGNOSIS — Z0001 Encounter for general adult medical examination with abnormal findings: Secondary | ICD-10-CM

## 2021-05-09 DIAGNOSIS — E538 Deficiency of other specified B group vitamins: Secondary | ICD-10-CM | POA: Diagnosis not present

## 2021-05-09 DIAGNOSIS — R739 Hyperglycemia, unspecified: Secondary | ICD-10-CM

## 2021-05-09 DIAGNOSIS — M542 Cervicalgia: Secondary | ICD-10-CM | POA: Insufficient documentation

## 2021-05-09 DIAGNOSIS — E782 Mixed hyperlipidemia: Secondary | ICD-10-CM

## 2021-05-09 DIAGNOSIS — E559 Vitamin D deficiency, unspecified: Secondary | ICD-10-CM

## 2021-05-09 LAB — CBC WITH DIFFERENTIAL/PLATELET
Basophils Absolute: 0.1 10*3/uL (ref 0.0–0.1)
Basophils Relative: 0.8 % (ref 0.0–3.0)
Eosinophils Absolute: 0.1 10*3/uL (ref 0.0–0.7)
Eosinophils Relative: 1.7 % (ref 0.0–5.0)
HCT: 46.7 % (ref 39.0–52.0)
Hemoglobin: 15.9 g/dL (ref 13.0–17.0)
Lymphocytes Relative: 26.9 % (ref 12.0–46.0)
Lymphs Abs: 2.3 10*3/uL (ref 0.7–4.0)
MCHC: 34.1 g/dL (ref 30.0–36.0)
MCV: 86.1 fl (ref 78.0–100.0)
Monocytes Absolute: 0.6 10*3/uL (ref 0.1–1.0)
Monocytes Relative: 6.9 % (ref 3.0–12.0)
Neutro Abs: 5.4 10*3/uL (ref 1.4–7.7)
Neutrophils Relative %: 63.7 % (ref 43.0–77.0)
Platelets: 238 10*3/uL (ref 150.0–400.0)
RBC: 5.42 Mil/uL (ref 4.22–5.81)
RDW: 14.6 % (ref 11.5–15.5)
WBC: 8.5 10*3/uL (ref 4.0–10.5)

## 2021-05-09 LAB — URINALYSIS, ROUTINE W REFLEX MICROSCOPIC
Bilirubin Urine: NEGATIVE
Hgb urine dipstick: NEGATIVE
Ketones, ur: NEGATIVE
Leukocytes,Ua: NEGATIVE
Nitrite: NEGATIVE
RBC / HPF: NONE SEEN (ref 0–?)
Specific Gravity, Urine: 1.02 (ref 1.000–1.030)
Total Protein, Urine: NEGATIVE
Urine Glucose: NEGATIVE
Urobilinogen, UA: 0.2 (ref 0.0–1.0)
WBC, UA: NONE SEEN (ref 0–?)
pH: 6 (ref 5.0–8.0)

## 2021-05-09 LAB — LIPID PANEL
Cholesterol: 228 mg/dL — ABNORMAL HIGH (ref 0–200)
HDL: 37.6 mg/dL — ABNORMAL LOW (ref >39.00)
LDL Cholesterol: 158 mg/dL — ABNORMAL HIGH (ref 0–99)
NonHDL: 190.6
Total CHOL/HDL Ratio: 6
Triglycerides: 162 mg/dL — ABNORMAL HIGH (ref 0.0–149.0)
VLDL: 32.4 mg/dL (ref 0.0–40.0)

## 2021-05-09 LAB — HEPATIC FUNCTION PANEL
ALT: 13 U/L (ref 0–53)
AST: 17 U/L (ref 0–37)
Albumin: 4.4 g/dL (ref 3.5–5.2)
Alkaline Phosphatase: 71 U/L (ref 39–117)
Bilirubin, Direct: 0.1 mg/dL (ref 0.0–0.3)
Total Bilirubin: 0.8 mg/dL (ref 0.2–1.2)
Total Protein: 7.4 g/dL (ref 6.0–8.3)

## 2021-05-09 LAB — BASIC METABOLIC PANEL
BUN: 14 mg/dL (ref 6–23)
CO2: 29 mEq/L (ref 19–32)
Calcium: 9.3 mg/dL (ref 8.4–10.5)
Chloride: 102 mEq/L (ref 96–112)
Creatinine, Ser: 1.41 mg/dL (ref 0.40–1.50)
GFR: 57.76 mL/min — ABNORMAL LOW (ref >60.00)
Glucose, Bld: 85 mg/dL (ref 70–99)
Potassium: 4.1 mEq/L (ref 3.5–5.1)
Sodium: 137 mEq/L (ref 135–145)

## 2021-05-09 LAB — VITAMIN B12: Vitamin B-12: 245 pg/mL (ref 211–911)

## 2021-05-09 LAB — HEMOGLOBIN A1C: Hgb A1c MFr Bld: 5.5 % (ref 4.6–6.5)

## 2021-05-09 LAB — TSH: TSH: 1.52 u[IU]/mL (ref 0.35–5.50)

## 2021-05-09 LAB — PSA: PSA: 1.02 ng/mL (ref 0.10–4.00)

## 2021-05-09 LAB — VITAMIN D 25 HYDROXY (VIT D DEFICIENCY, FRACTURES): VITD: 25.94 ng/mL — ABNORMAL LOW (ref 30.00–100.00)

## 2021-05-09 MED ORDER — PREDNISONE 10 MG PO TABS: ORAL_TABLET | ORAL | 0 refills | Status: DC

## 2021-05-09 MED ORDER — CYCLOBENZAPRINE HCL 5 MG PO TABS: ORAL_TABLET | ORAL | 1 refills | Status: DC

## 2021-05-09 MED ORDER — TRAMADOL HCL 50 MG PO TABS: 50.0000 mg | ORAL_TABLET | Freq: Four times a day (QID) | ORAL | 2 refills | Status: AC | PRN
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
## 2021-05-09 NOTE — Patient Instructions (Signed)
Please take all new medication as prescribed - the pain medication, muscle relaxer as needed, and prednisone for swelling ? ?Please continue all other medications as before, and refills have been done if requested. ? ?Please have the pharmacy call with any other refills you may need. ? ?Please continue your efforts at being more active, low cholesterol diet, and weight control. ? ?You are otherwise up to date with prevention measures today. ? ?Please keep your appointments with your specialists as you may have planned ? ?Please go to the XRAY Department in the first floor for the x-ray testing ? ?Please go to the LAB at the blood drawing area for the tests to be done ? ?You will be contacted by phone if any changes need to be made immediately.  Otherwise, you will receive a letter about your results with an explanation, but please check with MyChart first. ? ?Please remember to sign up for MyChart if you have not done so, as this will be important to you in the future with finding out test results, communicating by private email, and scheduling acute appointments online when needed. ? ?Please make an Appointment to return for your 1 year visit, or sooner if needed; and call if you change your mind about the colonoscopy or cologuard ?

## 2021-05-16 ENCOUNTER — Encounter: Payer: Self-pay | Admitting: Internal Medicine

## 2021-05-16 NOTE — Assessment & Plan Note (Signed)
Age and sex appropriate education and counseling updated with regular exercise and diet ?Referrals for preventative services - declines colonoscopy ?Immunizations addressed - declines shingrix ?Smoking counseling  - none needed ?Evidence for depression or other mood disorder - none significant ?Most recent labs reviewed. ?I have personally reviewed and have noted: ?1) the patient's medical and social history ?2) The patient's current medications and supplements ?3) The patient's height, weight, and BMI have been recorded in the chart ? ?

## 2021-05-16 NOTE — Assessment & Plan Note (Signed)
Lab Results  ?Component Value Date  ? LDLCALC 158 (H) 05/09/2021  ? ?Severe uncontrolled, pt to continue current low chol diet, declines statin ? ?

## 2021-05-16 NOTE — Assessment & Plan Note (Signed)
Last vitamin D ?Lab Results  ?Component Value Date  ? VD25OH 25.94 (L) 05/09/2021  ? ?Low, to start oral replacement ? ?

## 2021-05-16 NOTE — Assessment & Plan Note (Signed)
Lab Results  ?Component Value Date  ? [id number] 245 05/09/2021  ? ?Low, to start oral replacement - b12 1000 mcg qd ? ?

## 2021-05-16 NOTE — Assessment & Plan Note (Signed)
Mild to mod worsening x 1 wk, for film per pt reqeust, also pain med, muscle relaxer prn, prednisone ?

## 2021-05-23 ENCOUNTER — Telehealth: Payer: Self-pay | Admitting: Internal Medicine

## 2021-05-23 DIAGNOSIS — M542 Cervicalgia: Secondary | ICD-10-CM

## 2021-05-23 NOTE — Telephone Encounter (Signed)
Spouse called in and wants to know if pt can get a referral to a neck doctor.  ? ?States that his problem still persists.  ?

## 2021-05-24 NOTE — Telephone Encounter (Signed)
Patient notified

## 2021-05-24 NOTE — Telephone Encounter (Signed)
Ok I hope it is ok to refer him to sports medicine - I will order, thanks ?

## 2021-05-25 ENCOUNTER — Ambulatory Visit (INDEPENDENT_AMBULATORY_CARE_PROVIDER_SITE_OTHER): Payer: Medicaid Other | Admitting: Sports Medicine

## 2021-05-25 VITALS — BP 126/80 | HR 79 | Ht 66.0 in | Wt 193.0 lb

## 2021-05-25 DIAGNOSIS — M5030 Other cervical disc degeneration, unspecified cervical region: Secondary | ICD-10-CM

## 2021-05-25 DIAGNOSIS — S46811A Strain of other muscles, fascia and tendons at shoulder and upper arm level, right arm, initial encounter: Secondary | ICD-10-CM | POA: Diagnosis not present

## 2021-05-25 DIAGNOSIS — M542 Cervicalgia: Secondary | ICD-10-CM

## 2021-05-25 MED ORDER — MELOXICAM 15 MG PO TABS: 15.0000 mg | ORAL_TABLET | Freq: Every day | ORAL | 0 refills | Status: DC

## 2021-05-25 NOTE — Progress Notes (Signed)
Mark Adams D.[facility] [address] [place] [address] Phone: [phone]   Assessment and Plan:    1. DDD (degenerative disc disease), cervical 2. Neck pain 3. Strain of right trapezius muscle, initial encounter -Acute, uncomplicated, initial sports medicine visit - Suspect a strain of right trapezius muscle with underlying mild DDD based on HPI, physical exam, x-ray - Start meloxicam 15 mg daily x2 weeks.  If still having pain after 2 weeks, complete 3rd-week of meloxicam. May use remaining meloxicam as needed once daily for pain control.  Do not to use additional NSAIDs while taking meloxicam.  May use Tylenol [phone] mg 2 to 3 times a day for breakthrough pain. -HEP for trapezius - Reviewed x-ray with patient in clinic.  My interpretation: No acute fracture or vertebral collapse.  Maintained cervical lordosis.  Mild changes most prominent at C4-C5 with anterior vertebral spurring   Pertinent previous records reviewed include C-spine x-ray 05/09/2021, PCP note 05/09/2021, telephone encounter 05/24/2021   Follow Up: 3 weeks for reevaluation.  Could consider OMT versus trigger point injections if no improvement or worsening of symptoms   Subjective:   I, Mark Adams, am serving as a Neurosurgeon for Doctor Mark Adams  Chief Complaint: neck pain   HPI:   05/25/21 Patient is a 51 year old male complaining of neck pain. Patient states that he has had tightness and neck pain for about 3 weeks now , no MOI has pain at night when sleeping, right sided neck pain , no radiating pain , does get numbness and tingling, decreased ROM , has been having headaches when he sleeps (tension headaches) was rx, prednisone, tramadol and thinks meloxicam, meds make him sleepy   Relevant Historical Information: None pertinent  Additional pertinent review of systems negative.   Current Outpatient Medications:    Cholecalciferol (THERA-D 2000) 50 MCG (2000  UT) TABS, 1 tab by mouth once daily, Disp: 30 tablet, Rfl: 99   cyclobenzaprine (FLEXERIL) 5 MG tablet, TAKE 1 TABLET BY MOUTH THREE TIMES A DAY AS NEEDED FOR MUSCLE SPASMS, Disp: 40 tablet, Rfl: 1   Diclofenac Sodium 2 % SOLN, Apply twice daily., Disp: , Rfl:    escitalopram (LEXAPRO) 10 MG tablet, TAKE 1 TABLET BY MOUTH DAILY AS NEEDED FOR DEPRESSION., Disp: 90 tablet, Rfl: 2   fluocinonide ointment (LIDEX) 0.05 %, Apply 1 application topically 2 (two) times daily., Disp: 30 g, Rfl: 0   gabapentin (NEURONTIN) 100 MG capsule, TAKE 1 CAPSULE BY MOUTH TWICE A DAY, Disp: 180 capsule, Rfl: 1   meloxicam (MOBIC) 15 MG tablet, TAKE 1 TABLET BY MOUTH DAILY AS NEEDED FOR PAIN., Disp: 90 tablet, Rfl: 2   meloxicam (MOBIC) 15 MG tablet, Take 1 tablet (15 mg total) by mouth daily., Disp: 30 tablet, Rfl: 0   pantoprazole (PROTONIX) 40 MG tablet, TAKE 1 TABLET BY MOUTH EVERY DAY, Disp: 90 tablet, Rfl: 3   predniSONE (DELTASONE) 10 MG tablet, 3 tabs by mouth per day for 3 days,2tabs per day for 3 days,1tab per day for 3 days, Disp: 18 tablet, Rfl: 0   traMADol (ULTRAM) 50 MG tablet, Take 1 tablet (50 mg total) by mouth every 6 (six) hours as needed., Disp: 60 tablet, Rfl: 2   Vitamin D, Ergocalciferol, (DRISDOL) 1.25 MG (50000 UNIT) CAPS capsule, Take 1 capsule (50,000 Units total) by mouth every 7 (seven) days., Disp: 12 capsule, Rfl: 0   Objective:     Vitals:   05/25/21 1055  BP: 126/80  Pulse: 79  SpO2: 97%  Weight: 193 lb (87.5 kg)  Height: 5\' 6"  (1.676 m)      Body mass index is 31.15 kg/m.    Physical Exam:    Cervical Spine: Posture normal Skin: normal, intact  Neurological:   Strength:  Right  Left   Deltoid 5/5 5/5  Bicep 5/5  5/5  Tricep 5/5 5/5  Wrist Flexion 5/5 5/5  Wrist Extension 5/5 5/5  Grip 5/5 5/5  Finger Abduction 5/5 5/5   Sensation: intact to light touch in upper extremities bilaterally  Spurling's:  negative bilaterally Neck ROM: Full active ROM TTP: Right  trapezius, cervical paraspinal, thoracic paraspinal NTTP: cervical spinous processes, left cervical paraspinal, left thoracic paraspinal, left trapezius    Electronically signed by:  Mark Adams D.[facility] 11:15 AM 05/25/21

## 2021-05-25 NOTE — Patient Instructions (Addendum)
Good to see you  - Start meloxicam 15 mg daily x2 weeks.  If still having pain after 2 weeks, complete 3rd-week of meloxicam. May use remaining meloxicam as needed once daily for pain control.  Do not to use additional NSAIDs while taking meloxicam.  May use Tylenol [phone] mg 2 to 3 times a day for breakthrough pain. Trap HEP  3 week follow up

## 2021-06-15 ENCOUNTER — Ambulatory Visit: Payer: Medicaid Other | Admitting: Sports Medicine

## 2021-06-21 ENCOUNTER — Other Ambulatory Visit: Payer: Self-pay | Admitting: Sports Medicine

## 2021-09-23 ENCOUNTER — Other Ambulatory Visit: Payer: Self-pay | Admitting: Internal Medicine

## 2021-10-19 ENCOUNTER — Other Ambulatory Visit: Payer: Self-pay | Admitting: Internal Medicine

## 2022-04-29 ENCOUNTER — Other Ambulatory Visit: Payer: Self-pay | Admitting: Internal Medicine

## 2022-05-18 ENCOUNTER — Encounter: Payer: Medicaid Other | Admitting: Internal Medicine

## 2022-05-29 ENCOUNTER — Encounter: Payer: Medicaid Other | Admitting: Internal Medicine

## 2022-06-06 ENCOUNTER — Ambulatory Visit (INDEPENDENT_AMBULATORY_CARE_PROVIDER_SITE_OTHER): Payer: Medicaid Other | Admitting: Internal Medicine

## 2022-06-06 ENCOUNTER — Encounter: Payer: Self-pay | Admitting: Internal Medicine

## 2022-06-06 VITALS — BP 124/78 | HR 75 | Temp 98.1°F | Ht 66.0 in | Wt 187.0 lb

## 2022-06-06 DIAGNOSIS — G8929 Other chronic pain: Secondary | ICD-10-CM

## 2022-06-06 DIAGNOSIS — E559 Vitamin D deficiency, unspecified: Secondary | ICD-10-CM | POA: Diagnosis not present

## 2022-06-06 DIAGNOSIS — E7800 Pure hypercholesterolemia, unspecified: Secondary | ICD-10-CM | POA: Diagnosis not present

## 2022-06-06 DIAGNOSIS — L309 Dermatitis, unspecified: Secondary | ICD-10-CM

## 2022-06-06 DIAGNOSIS — Z0001 Encounter for general adult medical examination with abnormal findings: Secondary | ICD-10-CM | POA: Diagnosis not present

## 2022-06-06 DIAGNOSIS — E538 Deficiency of other specified B group vitamins: Secondary | ICD-10-CM | POA: Diagnosis not present

## 2022-06-06 DIAGNOSIS — N320 Bladder-neck obstruction: Secondary | ICD-10-CM

## 2022-06-06 DIAGNOSIS — R739 Hyperglycemia, unspecified: Secondary | ICD-10-CM | POA: Diagnosis not present

## 2022-06-06 DIAGNOSIS — R296 Repeated falls: Secondary | ICD-10-CM

## 2022-06-06 DIAGNOSIS — F32A Depression, unspecified: Secondary | ICD-10-CM

## 2022-06-06 DIAGNOSIS — Z114 Encounter for screening for human immunodeficiency virus [HIV]: Secondary | ICD-10-CM

## 2022-06-06 DIAGNOSIS — M25512 Pain in left shoulder: Secondary | ICD-10-CM | POA: Diagnosis not present

## 2022-06-06 DIAGNOSIS — E611 Iron deficiency: Secondary | ICD-10-CM

## 2022-06-06 DIAGNOSIS — G4700 Insomnia, unspecified: Secondary | ICD-10-CM

## 2022-06-06 DIAGNOSIS — Z1211 Encounter for screening for malignant neoplasm of colon: Secondary | ICD-10-CM

## 2022-06-06 LAB — URINALYSIS, ROUTINE W REFLEX MICROSCOPIC
Bilirubin Urine: NEGATIVE
Hgb urine dipstick: NEGATIVE
Ketones, ur: NEGATIVE
Leukocytes,Ua: NEGATIVE
Nitrite: NEGATIVE
RBC / HPF: NONE SEEN (ref 0–?)
Specific Gravity, Urine: 1.025 (ref 1.000–1.030)
Total Protein, Urine: NEGATIVE
Urine Glucose: NEGATIVE
Urobilinogen, UA: 1 (ref 0.0–1.0)
pH: 6 (ref 5.0–8.0)

## 2022-06-06 LAB — HEPATIC FUNCTION PANEL
ALT: 13 U/L (ref 0–53)
AST: 16 U/L (ref 0–37)
Albumin: 4.4 g/dL (ref 3.5–5.2)
Alkaline Phosphatase: 63 U/L (ref 39–117)
Bilirubin, Direct: 0.1 mg/dL (ref 0.0–0.3)
Total Bilirubin: 0.9 mg/dL (ref 0.2–1.2)
Total Protein: 7.4 g/dL (ref 6.0–8.3)

## 2022-06-06 LAB — BASIC METABOLIC PANEL
BUN: 12 mg/dL (ref 6–23)
CO2: 26 mEq/L (ref 19–32)
Calcium: 9.3 mg/dL (ref 8.4–10.5)
Chloride: 103 mEq/L (ref 96–112)
Creatinine, Ser: 1.23 mg/dL (ref 0.40–1.50)
GFR: 67.53 mL/min (ref >60.00)
Glucose, Bld: 87 mg/dL (ref 70–99)
Potassium: 4.1 mEq/L (ref 3.5–5.1)
Sodium: 138 mEq/L (ref 135–145)

## 2022-06-06 LAB — CBC WITH DIFFERENTIAL/PLATELET
Basophils Absolute: 0.1 10*3/uL (ref 0.0–0.1)
Basophils Relative: 0.8 % (ref 0.0–3.0)
Eosinophils Absolute: 0.2 10*3/uL (ref 0.0–0.7)
Eosinophils Relative: 1.8 % (ref 0.0–5.0)
HCT: 44.4 % (ref 39.0–52.0)
Hemoglobin: 15.8 g/dL (ref 13.0–17.0)
Lymphocytes Relative: 30.1 % (ref 12.0–46.0)
Lymphs Abs: 2.5 10*3/uL (ref 0.7–4.0)
MCHC: 35.5 g/dL (ref 30.0–36.0)
MCV: 83.6 fl (ref 78.0–100.0)
Monocytes Absolute: 0.7 10*3/uL (ref 0.1–1.0)
Monocytes Relative: 8 % (ref 3.0–12.0)
Neutro Abs: 4.9 10*3/uL (ref 1.4–7.7)
Neutrophils Relative %: 59.3 % (ref 43.0–77.0)
Platelets: 253 10*3/uL (ref 150.0–400.0)
RBC: 5.31 Mil/uL (ref 4.22–5.81)
RDW: 14.3 % (ref 11.5–15.5)
WBC: 8.3 10*3/uL (ref 4.0–10.5)

## 2022-06-06 LAB — HEMOGLOBIN A1C: Hgb A1c MFr Bld: 5.1 % (ref 4.6–6.5)

## 2022-06-06 LAB — LIPID PANEL
Cholesterol: 222 mg/dL — ABNORMAL HIGH (ref 0–200)
HDL: 34.6 mg/dL — ABNORMAL LOW (ref >39.00)
NonHDL: 187.41
Total CHOL/HDL Ratio: 6
Triglycerides: 223 mg/dL — ABNORMAL HIGH (ref 0.0–149.0)
VLDL: 44.6 mg/dL — ABNORMAL HIGH (ref 0.0–40.0)

## 2022-06-06 LAB — IBC PANEL
Iron: 96 ug/dL (ref 42–165)
Saturation Ratios: 30.3 % (ref 20.0–50.0)
TIBC: 316.4 ug/dL (ref 250.0–450.0)
Transferrin: 226 mg/dL (ref 212.0–360.0)

## 2022-06-06 LAB — FERRITIN: Ferritin: 236.6 ng/mL (ref 22.0–322.0)

## 2022-06-06 LAB — LDL CHOLESTEROL, DIRECT: Direct LDL: 137 mg/dL

## 2022-06-06 LAB — VITAMIN D 25 HYDROXY (VIT D DEFICIENCY, FRACTURES): VITD: 39.78 ng/mL (ref 30.00–100.00)

## 2022-06-06 LAB — PSA: PSA: 1.17 ng/mL (ref 0.10–4.00)

## 2022-06-06 LAB — TSH: TSH: 0.74 u[IU]/mL (ref 0.35–5.50)

## 2022-06-06 LAB — VITAMIN B12: Vitamin B-12: 302 pg/mL (ref 211–911)

## 2022-06-06 MED ORDER — MELOXICAM 15 MG PO TABS: ORAL_TABLET | ORAL | 3 refills | Status: AC

## 2022-06-06 MED ORDER — AMITRIPTYLINE HCL 100 MG PO TABS: ORAL_TABLET | ORAL | 1 refills | Status: DC

## 2022-06-06 MED ORDER — FLUOCINONIDE 0.05 % EX OINT: 1.0000 | TOPICAL_OINTMENT | Freq: Two times a day (BID) | CUTANEOUS | 2 refills | Status: DC

## 2022-06-06 MED ORDER — PANTOPRAZOLE SODIUM 40 MG PO TBEC: 40.0000 mg | DELAYED_RELEASE_TABLET | Freq: Every day | ORAL | 3 refills | Status: DC

## 2022-06-06 MED ORDER — GABAPENTIN 100 MG PO CAPS: 100.0000 mg | ORAL_CAPSULE | Freq: Two times a day (BID) | ORAL | 1 refills | Status: DC

## 2022-06-06 MED ORDER — ESCITALOPRAM OXALATE 10 MG PO TABS: ORAL_TABLET | ORAL | 3 refills | Status: DC

## 2022-06-06 NOTE — Patient Instructions (Addendum)
Please have your Shingrix (shingles) shots done at your local pharmacy.  Please take all new medication as prescribed  - the generic elavil for sleep and nerve pain in the legs at bedtime  Please continue all other medications as before, and refills have been done if requested.  Please have the pharmacy call with any other refills you may need.  Please continue your efforts at being more active, low cholesterol diet, and weight control.  You are otherwise up to date with prevention measures today.  Please keep your appointments with your specialists as you may have planned  Please see Sports Medicine on the first floor for your left shoulder   You will be contacted regarding the referral for: Physical Therapy, Colonoscopy  Please go to the LAB at the blood drawing area for the tests to be done  You will be contacted by phone if any changes need to be made immediately.  Otherwise, you will receive a letter about your results with an explanation, but please check with MyChart first.  Please remember to sign up for MyChart if you have not done so, as this will be important to you in the future with finding out test results, communicating by private email, and scheduling acute appointments online when needed.  Please make an Appointment to return for your 1 year visit, or sooner if needed
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
## 2022-06-07 ENCOUNTER — Encounter: Payer: Self-pay | Admitting: Internal Medicine

## 2022-06-07 DIAGNOSIS — G4700 Insomnia, unspecified: Secondary | ICD-10-CM | POA: Insufficient documentation

## 2022-06-07 LAB — HIV ANTIBODY (ROUTINE TESTING W REFLEX): HIV 1&2 Ab, 4th Generation: NONREACTIVE

## 2022-06-07 NOTE — Assessment & Plan Note (Signed)
Etiology unclear, pt directed to make appt with Sports Med on first floor

## 2022-06-07 NOTE — Assessment & Plan Note (Signed)
Uncontrolled mild worsening, for restart lexapro 10 qd, declines change to cymbalta given neuropathy

## 2022-06-07 NOTE — Assessment & Plan Note (Signed)
Last vitamin D Lab Results  Component Value Date   VD25OH 39.78 06/06/2022   Low, to start oral replacement

## 2022-06-07 NOTE — Assessment & Plan Note (Signed)
Mild worsening, for lidex prn

## 2022-06-07 NOTE — Assessment & Plan Note (Signed)
Lab Results  Component Value Date   LDLCALC 158 (H) 05/09/2021   Severe uncontrolled, for lower chol diet, pt declines statin or other tx for now

## 2022-06-07 NOTE — Assessment & Plan Note (Signed)
Worsesning, for elavil 50 - 100 qhs prn

## 2022-06-07 NOTE — Assessment & Plan Note (Signed)
Age and sex appropriate education and counseling updated with regular exercise and diet Referrals for preventative services - for HIV screen, for colonoscopy Immunizations addressed - for shingrix at pharmacy Smoking counseling  - none needed Evidence for depression or other mood disorder - none significant Most recent labs reviewed. I have personally reviewed and have noted: 1) the patient's medical and social history 2) The patient's current medications and supplements 3) The patient's height, weight, and BMI have been recorded in the chart

## 2022-06-07 NOTE — Assessment & Plan Note (Signed)
For PT eval 

## 2022-06-07 NOTE — Assessment & Plan Note (Signed)
Lab Results  Component Value Date   VITAMINB12 302 06/06/2022   Low, to start oral replacement - b12 1000 mcg qd

## 2022-08-28 ENCOUNTER — Encounter: Payer: Self-pay | Admitting: Family Medicine

## 2022-08-28 ENCOUNTER — Ambulatory Visit: Payer: Medicaid Other | Admitting: Family Medicine

## 2022-08-28 VITALS — BP 142/100 | HR 91 | Temp 98.7°F | Resp 18 | Ht 66.0 in | Wt 190.5 lb

## 2022-08-28 DIAGNOSIS — R300 Dysuria: Secondary | ICD-10-CM

## 2022-08-28 DIAGNOSIS — R030 Elevated blood-pressure reading, without diagnosis of hypertension: Secondary | ICD-10-CM

## 2022-08-28 DIAGNOSIS — M545 Low back pain, unspecified: Secondary | ICD-10-CM | POA: Diagnosis not present

## 2022-08-28 LAB — POC URINALSYSI DIPSTICK (AUTOMATED)
Bilirubin, UA: NEGATIVE
Blood, UA: NEGATIVE
Glucose, UA: NEGATIVE
Ketones, UA: NEGATIVE
Leukocytes, UA: NEGATIVE
Nitrite, UA: NEGATIVE
Protein, UA: NEGATIVE
Spec Grav, UA: >=1.03 — AB (ref 1.010–1.025)
Urobilinogen, UA: 0.2 E.U./dL
pH, UA: 5.5 (ref 5.0–8.0)

## 2022-08-28 MED ORDER — CIPROFLOXACIN HCL 500 MG PO TABS: 500.0000 mg | ORAL_TABLET | Freq: Two times a day (BID) | ORAL | 0 refills | Status: AC

## 2022-08-28 NOTE — Patient Instructions (Signed)
It was very nice to see you today!  Check blood pressures.   PLEASE NOTE:  If you had any lab tests please let us know if you have not heard back within a few days. You may see your results on MyChart before we have a chance to review them but we will give you a call once they are reviewed by [place]. If we ordered any referrals today, please let us know if you have not heard from their office within the next week.   Please try these tips to maintain a healthy lifestyle:  Eat most of your calories during the day when you are active. Eliminate processed foods including packaged sweets (pies, cakes, cookies), reduce intake of potatoes, white bread, white pasta, and white rice. Look for whole grain options, oat flour or almond flour.  Each meal should contain half fruits/vegetables, one quarter protein, and one quarter carbs (no bigger than a computer mouse).  Cut down on sweet beverages. This includes juice, soda, and sweet tea. Also watch fruit intake, though this is a healthier sweet option, it still contains natural sugar! Limit to 3 servings daily.  Drink at least 1 glass of water with each meal and aim for at least 8 glasses per day  Exercise at least 150 minutes every week.
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
## 2022-08-29 LAB — URINE CULTURE
MICRO NUMBER:: 15355842
Result:: NO GROWTH
SPECIMEN QUALITY:: ADEQUATE

## 2022-09-14 ENCOUNTER — Ambulatory Visit: Payer: Medicaid Other | Admitting: Family Medicine

## 2022-09-14 ENCOUNTER — Encounter: Payer: Self-pay | Admitting: Family Medicine

## 2022-09-14 VITALS — BP 138/96 | HR 90 | Temp 97.6°F | Wt 192.0 lb

## 2022-09-14 DIAGNOSIS — R030 Elevated blood-pressure reading, without diagnosis of hypertension: Secondary | ICD-10-CM

## 2022-09-14 DIAGNOSIS — M5432 Sciatica, left side: Secondary | ICD-10-CM | POA: Diagnosis not present

## 2022-09-14 MED ORDER — KETOROLAC TROMETHAMINE 60 MG/2ML IM SOLN
60.0000 mg | Freq: Once | INTRAMUSCULAR | Status: AC
Start: 2022-09-14 — End: 2022-09-14
  Administered 2022-09-14: 60 mg via INTRAMUSCULAR
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
## 2022-09-14 NOTE — Patient Instructions (Addendum)
  You can take meloxicam for your pain starting tomorrow. This is an antiinflammatory. Take it with food.   Use a heating pad or ice pack.   Do the stretches in this packet.    Monitor your blood pressure at home and is your readings are consistently higher than 130/80, follow up with Dr. Jonny Ruiz.

## 2022-09-27 ENCOUNTER — Ambulatory Visit: Payer: Medicaid Other | Admitting: Internal Medicine

## 2022-10-08 ENCOUNTER — Encounter: Payer: Self-pay | Admitting: Internal Medicine

## 2022-10-08 ENCOUNTER — Ambulatory Visit (INDEPENDENT_AMBULATORY_CARE_PROVIDER_SITE_OTHER): Payer: Medicaid Other

## 2022-10-08 ENCOUNTER — Ambulatory Visit: Payer: Medicaid Other | Admitting: Internal Medicine

## 2022-10-08 VITALS — BP 126/82 | HR 70 | Temp 98.5°F | Ht 66.0 in | Wt 192.0 lb

## 2022-10-08 DIAGNOSIS — R1031 Right lower quadrant pain: Secondary | ICD-10-CM

## 2022-10-08 DIAGNOSIS — E7800 Pure hypercholesterolemia, unspecified: Secondary | ICD-10-CM

## 2022-10-08 DIAGNOSIS — R0781 Pleurodynia: Secondary | ICD-10-CM | POA: Insufficient documentation

## 2022-10-08 DIAGNOSIS — E559 Vitamin D deficiency, unspecified: Secondary | ICD-10-CM | POA: Diagnosis not present

## 2022-10-08 DIAGNOSIS — R030 Elevated blood-pressure reading, without diagnosis of hypertension: Secondary | ICD-10-CM

## 2022-10-08 DIAGNOSIS — E538 Deficiency of other specified B group vitamins: Secondary | ICD-10-CM

## 2022-10-08 DIAGNOSIS — Z1211 Encounter for screening for malignant neoplasm of colon: Secondary | ICD-10-CM

## 2022-10-08 LAB — BASIC METABOLIC PANEL
BUN: 13 mg/dL (ref 6–23)
CO2: 26 meq/L (ref 19–32)
Calcium: 8.9 mg/dL (ref 8.4–10.5)
Chloride: 104 meq/L (ref 96–112)
Creatinine, Ser: 1.12 mg/dL (ref 0.40–1.50)
GFR: 75.39 mL/min (ref >60.00)
Glucose, Bld: 87 mg/dL (ref 70–99)
Potassium: 4.2 meq/L (ref 3.5–5.1)
Sodium: 139 meq/L (ref 135–145)

## 2022-10-08 LAB — HEPATIC FUNCTION PANEL
ALT: 20 U/L (ref 0–53)
AST: 23 U/L (ref 0–37)
Albumin: 4.3 g/dL (ref 3.5–5.2)
Alkaline Phosphatase: 65 U/L (ref 39–117)
Bilirubin, Direct: 0.1 mg/dL (ref 0.0–0.3)
Total Bilirubin: 0.8 mg/dL (ref 0.2–1.2)
Total Protein: 7.3 g/dL (ref 6.0–8.3)

## 2022-10-08 LAB — URINALYSIS, ROUTINE W REFLEX MICROSCOPIC
Bilirubin Urine: NEGATIVE
Hgb urine dipstick: NEGATIVE
Ketones, ur: NEGATIVE
Leukocytes,Ua: NEGATIVE
Nitrite: NEGATIVE
RBC / HPF: NONE SEEN (ref 0–?)
Specific Gravity, Urine: 1.02 (ref 1.000–1.030)
Total Protein, Urine: NEGATIVE
Urine Glucose: NEGATIVE
Urobilinogen, UA: 0.2 (ref 0.0–1.0)
pH: 6 (ref 5.0–8.0)

## 2022-10-08 LAB — CBC WITH DIFFERENTIAL/PLATELET
Basophils Absolute: 0.1 10*3/uL (ref 0.0–0.1)
Basophils Relative: 1.4 % (ref 0.0–3.0)
Eosinophils Absolute: 0.3 10*3/uL (ref 0.0–0.7)
Eosinophils Relative: 3.2 % (ref 0.0–5.0)
HCT: 45.7 % (ref 39.0–52.0)
Hemoglobin: 15.7 g/dL (ref 13.0–17.0)
Lymphocytes Relative: 35 % (ref 12.0–46.0)
Lymphs Abs: 3 10*3/uL (ref 0.7–4.0)
MCHC: 34.5 g/dL (ref 30.0–36.0)
MCV: 84.9 fL (ref 78.0–100.0)
Monocytes Absolute: 0.5 10*3/uL (ref 0.1–1.0)
Monocytes Relative: 5.9 % (ref 3.0–12.0)
Neutro Abs: 4.6 10*3/uL (ref 1.4–7.7)
Neutrophils Relative %: 54.5 % (ref 43.0–77.0)
Platelets: 252 10*3/uL (ref 150.0–400.0)
RBC: 5.38 Mil/uL (ref 4.22–5.81)
RDW: 14.3 % (ref 11.5–15.5)
WBC: 8.4 10*3/uL (ref 4.0–10.5)

## 2022-10-08 LAB — LIPASE: Lipase: 18 U/L (ref 11.0–59.0)

## 2022-10-08 MED ORDER — ROSUVASTATIN CALCIUM 10 MG PO TABS: 10.0000 mg | ORAL_TABLET | Freq: Every day | ORAL | 3 refills | Status: DC

## 2022-10-08 MED ORDER — TRULANCE 3 MG PO TABS: ORAL_TABLET | ORAL | 11 refills | Status: AC

## 2022-10-08 NOTE — Progress Notes (Signed)
The test results show that your current treatment is OK, as the tests are stable.  Please continue the same plan.  There is no other need for change of treatment or further evaluation based on these results, at this time.  thanks 

## 2022-10-08 NOTE — Assessment & Plan Note (Signed)
Last vitamin D Lab Results  Component Value Date   VD25OH 39.78 06/06/2022   Low, to start oral replacement

## 2022-10-08 NOTE — Assessment & Plan Note (Signed)
Lab Results  Component Value Date   VITAMINB12 302 06/06/2022   Low, to start oral replacement - b12 1000 mcg qd

## 2022-10-08 NOTE — Assessment & Plan Note (Signed)
BP Readings from Last 3 Encounters:  10/08/22 126/82  09/14/22 (!) 138/96  08/28/22 (!) 142/100   Stable, pt to continue medical treatment  - diet , wt control

## 2022-10-08 NOTE — Patient Instructions (Addendum)
Please take all new medication as prescribed - the crestor 10 mg per day, and the trulance for constipation as needed  Please continue all other medications as before, and refills have been done if requested.  Please have the pharmacy call with any other refills you may need.  Please continue your efforts at being more active, low cholesterol diet, and weight control.  You are otherwise up to date with prevention measures today.  Please keep your appointments with your specialists as you may have planned  You will be contacted regarding the referral for: colonoscopy  Please go to the XRAY Department in the first floor for the x-ray testing  Please go to the LAB at the blood drawing area for the tests to be done  You will be contacted by phone if any changes need to be made immediately.  Otherwise, you will receive a letter about your results with an explanation, but please check with MyChart first.  Please make an Appointment to return in 6 months, or sooner if needed
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
## 2022-10-08 NOTE — Assessment & Plan Note (Signed)
Ok for left rib xrays, r/o fracture

## 2022-10-08 NOTE — Assessment & Plan Note (Signed)
Lab Results  Component Value Date   LDLCALC 158 (H) 05/09/2021   Uncontrolled, to start crestor 10 qd

## 2022-10-08 NOTE — Assessment & Plan Note (Signed)
Exam benign, for xray, ok for trulance 3 mg every day prn constipatoin

## 2022-10-24 ENCOUNTER — Telehealth: Payer: Self-pay

## 2022-10-24 ENCOUNTER — Other Ambulatory Visit (HOSPITAL_COMMUNITY): Payer: Self-pay

## 2022-10-24 NOTE — Telephone Encounter (Signed)
Pharmacy Patient Advocate Encounter   Received notification from CoverMyMeds that prior authorization for Trulance 3mg  tabs is required/requested.   Insurance verification completed.   The patient is insured through [facility] .   Per test claim: PA required; PA started via CoverMyMeds. KEY [id number] . Waiting for clinical questions to populate.

## 2022-10-24 NOTE — Telephone Encounter (Signed)
Clinical questions answered and PA submitted

## 2022-10-29 NOTE — Telephone Encounter (Signed)
Pharmacy Patient Advocate Encounter  Received notification from [facility] that Prior Authorization for Trulance 3mg  tabs has been DENIED.  Full denial letter will be uploaded to the media tab. See denial reason below.  Here are the policy requirements your request did not meet:  The request does not meet the [facility] Medicaid Preferred Drug List (PDL) trial and failure criteria for the use of a non-preferred drug. To meet the criteria for approval, you must first try two of the following preferred drugs on the Statewide PDL:  Amitiza (Brand), Linzess, Lubiprostone capsule (generic for Amitiza)  PA #/Case ID/Reference #: [id number]   Please be advised we currently do not have a Pharmacist to review denials, therefore you will need to process appeals accordingly as needed. Thanks for your support at this time. Contact for appeals are as follows: Phone: [phone], Fax: [phone]

## 2023-03-01 ENCOUNTER — Encounter: Payer: Self-pay | Admitting: Internal Medicine

## 2023-03-01 ENCOUNTER — Ambulatory Visit: Payer: Medicaid Other

## 2023-03-01 ENCOUNTER — Ambulatory Visit: Payer: Medicaid Other | Admitting: Internal Medicine

## 2023-03-01 VITALS — BP 116/68 | HR 73 | Temp 99.2°F | Ht 66.0 in | Wt 188.0 lb

## 2023-03-01 DIAGNOSIS — Z0001 Encounter for general adult medical examination with abnormal findings: Secondary | ICD-10-CM

## 2023-03-01 DIAGNOSIS — E7800 Pure hypercholesterolemia, unspecified: Secondary | ICD-10-CM | POA: Diagnosis not present

## 2023-03-01 DIAGNOSIS — E538 Deficiency of other specified B group vitamins: Secondary | ICD-10-CM

## 2023-03-01 DIAGNOSIS — R030 Elevated blood-pressure reading, without diagnosis of hypertension: Secondary | ICD-10-CM | POA: Diagnosis not present

## 2023-03-01 DIAGNOSIS — M79674 Pain in right toe(s): Secondary | ICD-10-CM | POA: Diagnosis not present

## 2023-03-01 DIAGNOSIS — R739 Hyperglycemia, unspecified: Secondary | ICD-10-CM

## 2023-03-01 DIAGNOSIS — E559 Vitamin D deficiency, unspecified: Secondary | ICD-10-CM

## 2023-03-01 DIAGNOSIS — Z125 Encounter for screening for malignant neoplasm of prostate: Secondary | ICD-10-CM
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
## 2023-03-02 ENCOUNTER — Encounter: Payer: Self-pay | Admitting: Internal Medicine

## 2023-03-03 ENCOUNTER — Encounter: Payer: Self-pay | Admitting: Internal Medicine

## 2023-03-03 DIAGNOSIS — M79674 Pain in right toe(s): Secondary | ICD-10-CM | POA: Insufficient documentation

## 2023-03-03 NOTE — Patient Instructions (Signed)
 Please continue all other medications as before, and refills have been done if requested.  Please have the pharmacy call with any other refills you may need.  Please continue your efforts at being more active, low cholesterol diet, and weight control.  You are otherwise up to date with prevention measures today.  Please keep your appointments with your specialists as you may have planned  Please go to the XRAY Department in the first floor for the x-ray testing  Please go to the LAB at the blood drawing area for the tests to be done  You will be contacted by phone if any changes need to be made immediately.  Otherwise, you will receive a letter about your results with an explanation, but please check with MyChart first.  Please make an Appointment to return in 6 months, or sooner if needed

## 2023-03-03 NOTE — Assessment & Plan Note (Signed)
 BP Readings from Last 3 Encounters:  03/01/23 116/68  10/08/22 126/82  09/14/22 (!) 138/96   Stable, pt to continue medical treatment  - diet, wt control

## 2023-03-03 NOTE — Assessment & Plan Note (Signed)
 Lab Results  Component Value Date   VITAMINB12 302 06/06/2022   Stable, cont oral replacement - b12 1000 mcg qd

## 2023-03-03 NOTE — Assessment & Plan Note (Signed)
 Age and sex appropriate education and counseling updated with regular exercise and diet Referrals for preventative services - declines colonoscopy Immunizations addressed - for shingrix at pharmacy, decilines flu shot Smoking counseling  - none needed Evidence for depression or other mood disorder - none significant Most recent labs reviewed. I have personally reviewed and have noted: 1) the patient's medical and social history 2) The patient's current medications and supplements 3) The patient's height, weight, and BMI have been recorded in the chart

## 2023-03-03 NOTE — Assessment & Plan Note (Signed)
 Lab Results  Component Value Date   LDLCALC 158 (H) 05/09/2021   Severe uncontrolled, pt to restart statin crestor 10 qd
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
## 2023-03-03 NOTE — Assessment & Plan Note (Signed)
 Last vitamin D Lab Results  Component Value Date   VD25OH 39.78 06/06/2022   Low, to start oral replacement

## 2023-03-20 ENCOUNTER — Other Ambulatory Visit (INDEPENDENT_AMBULATORY_CARE_PROVIDER_SITE_OTHER): Payer: Self-pay

## 2023-03-20 ENCOUNTER — Ambulatory Visit: Admitting: Family

## 2023-03-20 DIAGNOSIS — M79674 Pain in right toe(s): Secondary | ICD-10-CM
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
## 2023-03-27 ENCOUNTER — Encounter: Payer: Self-pay | Admitting: Family

## 2023-06-13 ENCOUNTER — Ambulatory Visit: Payer: Self-pay

## 2023-06-13 NOTE — Telephone Encounter (Signed)
 FYI Only or Action Required?: FYI only for provider  Patient was last seen in primary care on 03/01/2023 by Roslyn Coombe, MD. Called Nurse Triage reporting Fall. Symptoms began several days ago. Interventions attempted: OTC medications: tylenol  with relief. Symptoms are: unchanged.  Triage Disposition: See PCP When Office is Open (Within 3 Days)  Patient/caregiver understands and will follow disposition?: Yes    Copied from CRM [id number]. Topic: Clinical - Red Word Triage >> Jun 13, 2023  2:31 PM Juleen Oakland F wrote: Red Word that prompted transfer to Nurse Triage: Patient spouse Mariah Shines calling regarding patient having a fall and hurt his back and his left foot is numb Reason for Disposition  MILD weakness (i.e., does not interfere with ability to work, go to school, normal activities)  (Exception: Mild weakness is a chronic symptom.)  Answer Assessment - Initial Assessment Questions 1. MECHANISM: "How did the fall happen?"     "I was walking with my cane... and I must have stumbled and fell" 2. DOMESTIC VIOLENCE AND ELDER ABUSE SCREENING: "Did you fall because someone pushed you or tried to hurt you?" If Yes, ask: "Are you safe now?"     N/a 3. ONSET: "When did the fall happen?" (e.g., minutes, hours, or days ago)     2 weeks ago 4. LOCATION: "What part of the body hit the ground?" (e.g., back, buttocks, head, hips, knees, hands, head, stomach)     "I think my shoulder" a few days later neck, shoulder started swelling and sore Today with heal of L foot feeling numbness - still able to walk 5. INJURY: "Did you hurt (injure) yourself when you fell?" If Yes, ask: "What did you injure? Tell me more about this?" (e.g., body area; type of injury; pain severity)"     Denies any visible injuries 6. PAIN: "Is there any pain?" If Yes, ask: "How bad is the pain?" (e.g., Scale 1-10; or mild,  moderate, severe)   - NONE (0): No pain   - MILD (1-3): Doesn't interfere with normal activities    - MODERATE  (4-7): Interferes with normal activities or awakens from sleep    - SEVERE (8-10): Excruciating pain, unable to do any normal activities      5-7/10 - tylenol  with some relief Mostly at night when laying in bed Denies being on blood thinners or head injury 7. SIZE: For cuts, bruises, or swelling, ask: "How large is it?" (e.g., inches or centimeters)      Some swelling  8. PREGNANCY: "Is there any chance you are pregnant?" "When was your last menstrual period?"     N/a 9. OTHER SYMPTOMS: "Do you have any other symptoms?" (e.g., dizziness, fever, weakness; new onset or worsening).      denies 10. CAUSE: "What do you think caused the fall (or falling)?" (e.g., tripped, dizzy spell)       Mis-step  Protocols used: Falls and [facility]

## 2023-06-16 NOTE — Progress Notes (Unsigned)
    Subjective:    Patient ID: Mark Adams, male    DOB: 1970-04-28, 53 y.o.   MRN: [id number]      HPI Mark Adams is here for No chief complaint on file.   Fall, pain-  he fell over 2 weeks ago.  He was walking with his cane and stumbled.     Medications and allergies reviewed with patient and updated if appropriate.  Current Outpatient Medications on File Prior to Visit  Medication Sig Dispense Refill   amitriptyline  (ELAVIL ) 100 MG tablet Take 1/2 - 1 tab by mouth once at bedtime as needed 90 tablet 1   Cholecalciferol (THERA-D 2000) 50 MCG (2000 UT) TABS 1 tab by mouth once daily 30 tablet 99   cyclobenzaprine  (FLEXERIL ) 5 MG tablet TAKE 1 TABLET BY MOUTH THREE TIMES A DAY AS NEEDED FOR MUSCLE SPASMS 40 tablet 1   Diclofenac  Sodium 2 % SOLN Apply twice daily.     escitalopram  (LEXAPRO ) 10 MG tablet TAKE 1 TABLET BY MOUTH DAILY AS NEEDED FOR DEPRESSION. 90 tablet 3   fluocinonide  ointment (LIDEX ) 0.05 % Apply 1 Application topically 2 (two) times daily. 30 g 2   gabapentin  (NEURONTIN ) 100 MG capsule Take 1 capsule (100 mg total) by mouth 2 (two) times daily. 180 capsule 1   meloxicam  (MOBIC ) 15 MG tablet TAKE 1 TABLET BY MOUTH EVERY DAY AS NEEDED FOR PAIN 90 tablet 3   pantoprazole  (PROTONIX ) 40 MG tablet Take 1 tablet (40 mg total) by mouth daily. 90 tablet 3   Plecanatide  (TRULANCE ) 3 MG TABS 1 tab by mouth once daily as needed 30 tablet 11   rosuvastatin  (CRESTOR ) 10 MG tablet Take 1 tablet (10 mg total) by mouth daily. 90 tablet 3   traMADol  (ULTRAM ) 50 MG tablet Take 1 tablet (50 mg total) by mouth every 6 (six) hours as needed. 60 tablet 2   Vitamin D , Ergocalciferol , (DRISDOL ) 1.25 MG (50000 UNIT) CAPS capsule Take 1 capsule (50,000 Units total) by mouth every 7 (seven) days. 12 capsule 0   No current facility-administered medications on file prior to visit.    Review of Systems     Objective:  There were no vitals filed for this visit. BP Readings from Last 3  Encounters:  03/01/23 116/68  10/08/22 126/82  09/14/22 (!) 138/96   Wt Readings from Last 3 Encounters:  03/01/23 188 lb (85.3 kg)  10/08/22 192 lb (87.1 kg)  09/14/22 192 lb (87.1 kg)   There is no height or weight on file to calculate BMI.    Physical Exam         Assessment & Plan:    See Problem List for Assessment and Plan of chronic medical problems.

## 2023-06-17 ENCOUNTER — Ambulatory Visit: Admitting: Internal Medicine

## 2023-06-17 ENCOUNTER — Encounter: Payer: Self-pay | Admitting: Internal Medicine

## 2023-06-17 VITALS — BP 140/76 | HR 74 | Temp 98.0°F | Wt 187.0 lb

## 2023-06-17 DIAGNOSIS — R200 Anesthesia of skin: Secondary | ICD-10-CM | POA: Diagnosis not present

## 2023-06-17 DIAGNOSIS — M549 Dorsalgia, unspecified: Secondary | ICD-10-CM

## 2023-06-17 DIAGNOSIS — R202 Paresthesia of skin: Secondary | ICD-10-CM | POA: Diagnosis not present

## 2023-06-17 DIAGNOSIS — M542 Cervicalgia: Secondary | ICD-10-CM

## 2023-06-17 MED ORDER — METHYLPREDNISOLONE 4 MG PO TBPK: ORAL_TABLET | ORAL | 0 refills | Status: DC

## 2023-06-17 NOTE — Patient Instructions (Addendum)
          Medications changes include :   medrol dose pak -- steroids      Return if symptoms worsen or fail to improve.

## 2023-06-24 ENCOUNTER — Telehealth: Payer: Self-pay

## 2023-06-24 NOTE — Telephone Encounter (Signed)
 Copied from CRM [id number]. Topic: Clinical - Request for Lab/Test Order >> Jun 24, 2023  3:04 PM Marlan Silva wrote:  Reason for CRM: Patients wife called in requesting a MRI or CT scan be ordered for the patient. Patients wife states that the patient is still having back pain, and that the medication he was recently prescribed is not working. Patient is requesting a call back at  [phone]. Patients wife was also informed that being that she is not listed as a DPR on the patients chart, no other information can be disclosed.

## 2023-06-25 NOTE — Telephone Encounter (Signed)
 Needs ROV please, thanks

## 2023-06-26 NOTE — Telephone Encounter (Signed)
 Copied from CRM [id number]. Topic: Clinical - Medication Question >> Jun 26, 2023 11:14 AM Mark Adams wrote: Reason for CRM: [name] called in today stating that she called in to see if her husband could have orders placed for him to have a CT or MRI done on his back for his back pain.She has not heard back from anyone in regards to this.

## 2023-06-26 NOTE — Telephone Encounter (Signed)
 Called and scheduled Pt for OV.

## 2023-07-01 ENCOUNTER — Encounter: Payer: Self-pay | Admitting: Internal Medicine

## 2023-07-01 ENCOUNTER — Ambulatory Visit: Admitting: Internal Medicine

## 2023-07-01 VITALS — BP 134/78 | HR 65 | Temp 98.2°F | Ht 66.0 in | Wt 189.0 lb

## 2023-07-01 DIAGNOSIS — E559 Vitamin D deficiency, unspecified: Secondary | ICD-10-CM

## 2023-07-01 DIAGNOSIS — M5412 Radiculopathy, cervical region: Secondary | ICD-10-CM | POA: Diagnosis not present

## 2023-07-01 DIAGNOSIS — E7800 Pure hypercholesterolemia, unspecified: Secondary | ICD-10-CM | POA: Diagnosis not present

## 2023-07-01 DIAGNOSIS — R030 Elevated blood-pressure reading, without diagnosis of hypertension: Secondary | ICD-10-CM | POA: Diagnosis not present

## 2023-07-01 MED ORDER — CYCLOBENZAPRINE HCL 5 MG PO TABS: ORAL_TABLET | ORAL | 1 refills | Status: DC
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
## 2023-07-01 NOTE — Patient Instructions (Addendum)
 Please take all new medication as prescribed - the muscle relaxer as needed  Please continue all other medications as before, and refills have been done if requested.  Please have the pharmacy call with any other refills you may need.  Please keep your appointments with your specialists as you may have planned  You will be contacted regarding the referral for: MRI cervical spine  Please make an Appointment to return in 6 months after Jan 09 2024, or sooner if needed

## 2023-07-02 ENCOUNTER — Encounter: Payer: Self-pay | Admitting: Internal Medicine

## 2023-07-02 DIAGNOSIS — M5412 Radiculopathy, cervical region: Secondary | ICD-10-CM | POA: Insufficient documentation

## 2023-07-02 NOTE — Assessment & Plan Note (Signed)
 Last vitamin D Lab Results  Component Value Date   VD25OH 39.78 06/06/2022   Low, to start oral replacement

## 2023-07-02 NOTE — Assessment & Plan Note (Signed)
 Lab Results  Component Value Date   LDLCALC 158 (H) 05/09/2021   unocntrolled pt to continue current statin crestor  10 mg as tolerating well,  but declines lab f/u today

## 2023-07-02 NOTE — Assessment & Plan Note (Signed)
 Ikely etiology vs other - for MRI C spine, flexeril  5 tid prn trial, declines PT for now, and hold on further steroid tx or lyrica

## 2023-07-02 NOTE — Assessment & Plan Note (Signed)
 BP Readings from Last 3 Encounters:  07/01/23 134/78  06/17/23 (!) 140/76  03/01/23 116/68   Stable, pt to continue medical treatment  - diet, wt control

## 2023-07-10 ENCOUNTER — Encounter: Payer: Self-pay | Admitting: Internal Medicine

## 2023-07-15 ENCOUNTER — Encounter: Payer: Self-pay | Admitting: Internal Medicine

## 2023-07-15 ENCOUNTER — Ambulatory Visit: Payer: Self-pay | Admitting: Internal Medicine

## 2023-07-15 ENCOUNTER — Ambulatory Visit: Admitting: Internal Medicine

## 2023-07-15 VITALS — BP 108/78 | HR 100 | Temp 97.6°F | Ht 66.0 in | Wt 188.0 lb

## 2023-07-15 DIAGNOSIS — E7800 Pure hypercholesterolemia, unspecified: Secondary | ICD-10-CM

## 2023-07-15 DIAGNOSIS — R030 Elevated blood-pressure reading, without diagnosis of hypertension: Secondary | ICD-10-CM

## 2023-07-15 DIAGNOSIS — K047 Periapical abscess without sinus: Secondary | ICD-10-CM | POA: Insufficient documentation

## 2023-07-15 DIAGNOSIS — E559 Vitamin D deficiency, unspecified: Secondary | ICD-10-CM | POA: Diagnosis not present

## 2023-07-15 DIAGNOSIS — E538 Deficiency of other specified B group vitamins: Secondary | ICD-10-CM

## 2023-07-15 MED ORDER — AMOXICILLIN-POT CLAVULANATE 875-125 MG PO TABS: 1.0000 | ORAL_TABLET | Freq: Two times a day (BID) | ORAL | 0 refills | Status: DC

## 2023-07-15 NOTE — Patient Instructions (Signed)
 Please take all new medication as prescribed - the antibiotic  Please continue all other medications as before, and refills have been done if requested.  Please have the pharmacy call with any other refills you may need.  Please keep your appointments with your specialists as you may have planned

## 2023-07-15 NOTE — Assessment & Plan Note (Signed)
 Mild to mod, for antibx course, augmentin  bid course,to f/u any worsening symptoms or concerns

## 2023-07-15 NOTE — Assessment & Plan Note (Signed)
 Lab Results  Component Value Date   VITAMINB12 302 06/06/2022   Stable, cont oral replacement - b12 1000 mcg qd

## 2023-07-15 NOTE — Telephone Encounter (Signed)
 FYI Only or Action Required?: FYI only for provider.  Patient was last seen in primary care on 07/01/2023 by Norleen Lynwood ORN, MD. Called Nurse Triage reporting Sore Throat. Symptoms began several days ago. Interventions attempted: OTC medications: allergy medication, decongestant, heat. Symptoms are: unchanged.  Triage Disposition: See Physician Within 24 Hours  Patient/caregiver understands and will follow disposition?: Yes                  Copied from CRM [id number]. Topic: Clinical - Red Word Triage >> Jul 15, 2023  8:34 AM Mark Adams wrote: Red Word that prompted transfer to Nurse Triage: Pt's wife Mark Adams called in stating that pt is having an issue with his right ear and also the right side of his throat, having a bunch of ear pain and pain when swallowing, she states his pain is a 10. Pain started on Saturday Reason for Disposition  SEVERE (e.g., excruciating) throat pain  Answer Assessment - Initial Assessment Questions 1. ONSET: When did the throat start hurting? (Hours or days ago)      Saturday, started as an earache 2. SEVERITY: How bad is the sore throat? (Scale 1-10; mild, moderate or severe)   - MILD (1-3):  Doesn't interfere with eating or normal activities.   - MODERATE (4-7): Interferes with eating some solids and normal activities.   - SEVERE (8-10):  Excruciating pain, interferes with most normal activities.   - SEVERE WITH DYSPHAGIA (10): Can't swallow liquids, drooling.     Severe, 8-9/10 pain level 3. STREP EXPOSURE: Has there been any exposure to strep within the past week? If Yes, ask: What type of contact occurred?      No 4.  VIRAL SYMPTOMS: Are there any symptoms of a cold, such as a runny nose, cough, hoarse voice or red eyes?      No 5. FEVER: Do you have a fever? If Yes, ask: What is your temperature, how was it measured, and when did it start?     No 6. PUS ON THE TONSILS: Is there pus on the tonsils in the back of your throat?     Has  not looked 7. OTHER SYMPTOMS: Do you have any other symptoms? (e.g., difficulty breathing, headache, rash)     Denies  Protocols used: Sore Throat-A-AH

## 2023-07-15 NOTE — Assessment & Plan Note (Signed)
 Last vitamin D  Lab Results  Component Value Date   VD25OH 39.78 06/06/2022   Stable, cont oral replacement
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
## 2023-07-15 NOTE — Assessment & Plan Note (Signed)
 Lab Results  Component Value Date   LDLCALC 158 (H) 05/09/2021   uncontrolled, pt to continue current statin crestor  10 mg, low chol diet, and fu lab

## 2023-07-15 NOTE — Assessment & Plan Note (Signed)
 BP Readings from Last 3 Encounters:  07/15/23 108/78  07/01/23 134/78  06/17/23 (!) 140/76   Stable, pt to continue medical treatment   diet, wt control

## 2023-07-16 ENCOUNTER — Other Ambulatory Visit

## 2023-07-25 ENCOUNTER — Telehealth: Payer: Self-pay

## 2023-07-25 ENCOUNTER — Encounter: Payer: Self-pay | Admitting: Internal Medicine

## 2023-07-25 DIAGNOSIS — M5412 Radiculopathy, cervical region: Secondary | ICD-10-CM

## 2023-07-25 NOTE — Telephone Encounter (Signed)
 Copied from CRM [id number]. Topic: General - Other >> Jul 25, 2023  2:57 PM Franky GRADE wrote: Reason for CRM: Patient was scheduled to have an MRI but it was canceled due to not having authorization. He is still experiencing back pain and wants to know what are the next steps.

## 2023-07-31 MED ORDER — METHYLPREDNISOLONE 4 MG PO TBPK: ORAL_TABLET | ORAL | 0 refills | Status: DC

## 2023-07-31 NOTE — Telephone Encounter (Signed)
 I can do repeat medrol  dose pack (steroid), and refer to ortho if he likes in the meantime, just let me know

## 2023-07-31 NOTE — Telephone Encounter (Signed)
 Ok these 2 things are done

## 2023-08-09 ENCOUNTER — Ambulatory Visit: Admitting: Family

## 2023-08-09 ENCOUNTER — Other Ambulatory Visit (INDEPENDENT_AMBULATORY_CARE_PROVIDER_SITE_OTHER): Payer: Self-pay

## 2023-08-09 DIAGNOSIS — M549 Dorsalgia, unspecified: Secondary | ICD-10-CM

## 2023-08-09 DIAGNOSIS — M542 Cervicalgia: Secondary | ICD-10-CM | POA: Diagnosis not present
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
## 2023-08-13 ENCOUNTER — Encounter: Payer: Self-pay | Admitting: Family

## 2023-08-19 ENCOUNTER — Ambulatory Visit: Attending: Family | Admitting: Physical Therapy

## 2023-08-19 ENCOUNTER — Other Ambulatory Visit: Payer: Self-pay

## 2023-08-19 ENCOUNTER — Encounter: Payer: Self-pay | Admitting: Physical Therapy

## 2023-08-19 DIAGNOSIS — R293 Abnormal posture: Secondary | ICD-10-CM | POA: Diagnosis present

## 2023-08-19 DIAGNOSIS — M7989 Other specified soft tissue disorders: Secondary | ICD-10-CM | POA: Insufficient documentation

## 2023-08-19 DIAGNOSIS — M549 Dorsalgia, unspecified: Secondary | ICD-10-CM | POA: Diagnosis not present
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
## 2023-10-23 ENCOUNTER — Other Ambulatory Visit: Payer: Self-pay | Admitting: Internal Medicine

## 2023-10-23 ENCOUNTER — Other Ambulatory Visit: Payer: Self-pay

## 2023-11-11 ENCOUNTER — Encounter: Payer: Self-pay | Admitting: Radiology

## 2023-12-31 ENCOUNTER — Ambulatory Visit: Admitting: Internal Medicine

## 2024-01-14 ENCOUNTER — Ambulatory Visit: Admitting: Internal Medicine

## 2024-01-28 ENCOUNTER — Ambulatory Visit: Admitting: Internal Medicine

## 2024-01-28 ENCOUNTER — Ambulatory Visit: Payer: Self-pay | Admitting: Internal Medicine

## 2024-01-28 ENCOUNTER — Encounter: Payer: Self-pay | Admitting: Internal Medicine

## 2024-01-28 VITALS — BP 126/80 | HR 78 | Temp 98.9°F | Ht 66.0 in | Wt 190.0 lb

## 2024-01-28 DIAGNOSIS — K589 Irritable bowel syndrome without diarrhea: Secondary | ICD-10-CM

## 2024-01-28 DIAGNOSIS — E7800 Pure hypercholesterolemia, unspecified: Secondary | ICD-10-CM | POA: Diagnosis not present

## 2024-01-28 DIAGNOSIS — F32A Depression, unspecified: Secondary | ICD-10-CM

## 2024-01-28 DIAGNOSIS — L309 Dermatitis, unspecified: Secondary | ICD-10-CM | POA: Diagnosis not present

## 2024-01-28 DIAGNOSIS — E538 Deficiency of other specified B group vitamins: Secondary | ICD-10-CM

## 2024-01-28 DIAGNOSIS — R739 Hyperglycemia, unspecified: Secondary | ICD-10-CM | POA: Diagnosis not present

## 2024-01-28 DIAGNOSIS — K219 Gastro-esophageal reflux disease without esophagitis: Secondary | ICD-10-CM | POA: Diagnosis not present

## 2024-01-28 DIAGNOSIS — E559 Vitamin D deficiency, unspecified: Secondary | ICD-10-CM

## 2024-01-28 DIAGNOSIS — Z125 Encounter for screening for malignant neoplasm of prostate: Secondary | ICD-10-CM

## 2024-01-28 LAB — BASIC METABOLIC PANEL WITH GFR
BUN: 11 mg/dL (ref 6–23)
CO2: 27 meq/L (ref 19–32)
Calcium: 9.5 mg/dL (ref 8.4–10.5)
Chloride: 106 meq/L (ref 96–112)
Creatinine, Ser: 1.1 mg/dL (ref 0.40–1.50)
GFR: 76.33 mL/min
Glucose, Bld: 81 mg/dL (ref 70–99)
Potassium: 4.2 meq/L (ref 3.5–5.1)
Sodium: 140 meq/L (ref 135–145)

## 2024-01-28 LAB — CBC WITH DIFFERENTIAL/PLATELET
Basophils Absolute: 0.1 K/uL (ref 0.0–0.1)
Basophils Relative: 0.7 % (ref 0.0–3.0)
Eosinophils Absolute: 0.1 K/uL (ref 0.0–0.7)
Eosinophils Relative: 1.9 % (ref 0.0–5.0)
HCT: 43.6 % (ref 39.0–52.0)
Hemoglobin: 15.5 g/dL (ref 13.0–17.0)
Lymphocytes Relative: 31.3 % (ref 12.0–46.0)
Lymphs Abs: 2.5 K/uL (ref 0.7–4.0)
MCHC: 35.6 g/dL (ref 30.0–36.0)
MCV: 84.4 fl (ref 78.0–100.0)
Monocytes Absolute: 0.5 K/uL (ref 0.1–1.0)
Monocytes Relative: 6.8 % (ref 3.0–12.0)
Neutro Abs: 4.7 K/uL (ref 1.4–7.7)
Neutrophils Relative %: 59.3 % (ref 43.0–77.0)
Platelets: 238 K/uL (ref 150.0–400.0)
RBC: 5.17 Mil/uL (ref 4.22–5.81)
RDW: 14.2 % (ref 11.5–15.5)
WBC: 7.9 K/uL (ref 4.0–10.5)

## 2024-01-28 LAB — URINALYSIS, ROUTINE W REFLEX MICROSCOPIC
Hgb urine dipstick: NEGATIVE
Ketones, ur: NEGATIVE
Leukocytes,Ua: NEGATIVE
Nitrite: NEGATIVE
RBC / HPF: NONE SEEN
Specific Gravity, Urine: 1.025 (ref 1.000–1.030)
Total Protein, Urine: NEGATIVE
Urine Glucose: NEGATIVE
Urobilinogen, UA: 0.2 (ref 0.0–1.0)
pH: 6 (ref 5.0–8.0)

## 2024-01-28 LAB — LIPID PANEL
Cholesterol: 205 mg/dL — ABNORMAL HIGH (ref 28–200)
HDL: 36.7 mg/dL — ABNORMAL LOW
LDL Cholesterol: 141 mg/dL — ABNORMAL HIGH (ref 10–99)
NonHDL: 168.46
Total CHOL/HDL Ratio: 6
Triglycerides: 138 mg/dL (ref 10.0–149.0)
VLDL: 27.6 mg/dL (ref 0.0–40.0)

## 2024-01-28 LAB — HEPATIC FUNCTION PANEL
ALT: 17 U/L (ref 3–53)
AST: 19 U/L (ref 5–37)
Albumin: 4.4 g/dL (ref 3.5–5.2)
Alkaline Phosphatase: 57 U/L (ref 39–117)
Bilirubin, Direct: 0.1 mg/dL (ref 0.1–0.3)
Total Bilirubin: 0.9 mg/dL (ref 0.2–1.2)
Total Protein: 7.3 g/dL (ref 6.0–8.3)

## 2024-01-28 LAB — TSH: TSH: 0.64 u[IU]/mL (ref 0.35–5.50)

## 2024-01-28 LAB — VITAMIN B12: Vitamin B-12: 304 pg/mL (ref 211–911)

## 2024-01-28 LAB — PSA: PSA: 1.51 ng/mL (ref 0.10–4.00)

## 2024-01-28 LAB — HEMOGLOBIN A1C: Hgb A1c MFr Bld: 5.4 % (ref 4.6–6.5)

## 2024-01-28 LAB — VITAMIN D 25 HYDROXY (VIT D DEFICIENCY, FRACTURES): VITD: 23.17 ng/mL — ABNORMAL LOW (ref 30.00–100.00)

## 2024-01-28 MED ORDER — PANTOPRAZOLE SODIUM 40 MG PO TBEC: 40.0000 mg | DELAYED_RELEASE_TABLET | Freq: Every day | ORAL | 3 refills | Status: AC

## 2024-01-28 MED ORDER — FLUOCINONIDE 0.05 % EX OINT: 1.0000 | TOPICAL_OINTMENT | Freq: Two times a day (BID) | CUTANEOUS | 2 refills | Status: AC

## 2024-01-28 MED ORDER — GABAPENTIN 100 MG PO CAPS: 100.0000 mg | ORAL_CAPSULE | Freq: Two times a day (BID) | ORAL | 1 refills | Status: AC

## 2024-01-28 MED ORDER — ROSUVASTATIN CALCIUM 10 MG PO TABS: 10.0000 mg | ORAL_TABLET | Freq: Every day | ORAL | 3 refills | Status: AC

## 2024-01-28 MED ORDER — ESCITALOPRAM OXALATE 10 MG PO TABS: ORAL_TABLET | ORAL | 3 refills | Status: AC

## 2024-01-28 NOTE — Assessment & Plan Note (Signed)
 Mild to mod, for lidex  restart prn

## 2024-01-28 NOTE — Assessment & Plan Note (Signed)
 Last vitamin D Lab Results  Component Value Date   VD25OH 39.78 06/06/2022   Low, to start oral replacement

## 2024-01-28 NOTE — Patient Instructions (Signed)
 Ok to take the Miralax as needed for constipation  Ok to take the protonix  40 mg once daily  Please continue all other medications as before, and refills have been done if requested.  Please have the pharmacy call with any other refills you may need.  Please continue your efforts at being more active, low cholesterol diet, and weight control.  Please keep your appointments with your specialists as you may have planned  You will be contacted regarding the referral for: Counseling (psychology)  Please go to the LAB at the blood drawing area for the tests to be done  You will be contacted by phone if any changes need to be made immediately.  Otherwise, you will receive a letter about your results with an explanation, but please check with MyChart first.  Please make an Appointment to return in 6 months, or sooner if needed

## 2024-01-28 NOTE — Assessment & Plan Note (Signed)
 Lab Results  Component Value Date   VITAMINB12 302 06/06/2022   Stable, cont oral replacement - b12 1000 mcg qd

## 2024-01-28 NOTE — Assessment & Plan Note (Signed)
 Pt encouraged today with miralax 17 gm every day for mild uncontrolled chronic consipation

## 2024-01-28 NOTE — Assessment & Plan Note (Signed)
 Lab Results  Component Value Date   LDLCALC 158 (H) 05/09/2021   uncontrolled, pt to restart crestor  10 mg every day, f/u lipid panel
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
## 2024-01-28 NOTE — Assessment & Plan Note (Signed)
 Mild to mod, no SI or HI, for contd lexapro , and refer counseling, pt to f/u any worsening symptoms or concerns

## 2024-01-28 NOTE — Assessment & Plan Note (Signed)
 Mild to mod uncontrolled, for protonix  to be taken every day,  to f/u any worsening symptoms or concerns
